# Patient Record
Sex: Female | Born: 1990 | Race: White | Hispanic: No | Marital: Married | Smoking: Former smoker
Health system: Southern US, Community
[De-identification: ages and names within clinical notes are randomized; demographics above are authoritative.]

## PROBLEM LIST (undated history)

## (undated) ENCOUNTER — Emergency Department (HOSPITAL_COMMUNITY)

## (undated) DIAGNOSIS — N189 Chronic kidney disease, unspecified: Secondary | ICD-10-CM

## (undated) DIAGNOSIS — E282 Polycystic ovarian syndrome: Secondary | ICD-10-CM

## (undated) DIAGNOSIS — Z3483 Encounter for supervision of other normal pregnancy, third trimester: Secondary | ICD-10-CM

## (undated) HISTORY — DX: Chronic kidney disease, unspecified: N18.9

---
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
## 2010-11-05 LAB — RPR: RPR Ser Ql: NONREACTIVE

## 2010-11-05 LAB — CBC
HCT: 36.1 % (ref 36.0–46.0)
HCT: 40.3 % (ref 36.0–46.0)
Hemoglobin: 12.3 g/dL (ref 12.0–15.0)
Hemoglobin: 14 g/dL (ref 12.0–15.0)
MCHC: 34 g/dL (ref 30.0–36.0)
MCHC: 34.8 g/dL (ref 30.0–36.0)
MCV: 84.3 fL (ref 78.0–100.0)
MCV: 86.1 fL (ref 78.0–100.0)
Platelets: 142 10*3/uL — ABNORMAL LOW (ref 150–400)
Platelets: 142 10*3/uL — ABNORMAL LOW (ref 150–400)
RBC: 4.19 MIL/uL (ref 3.87–5.11)
RBC: 4.79 MIL/uL (ref 3.87–5.11)
RDW: 14.8 % (ref 11.5–15.5)
RDW: 14.8 % (ref 11.5–15.5)
WBC: 11.4 10*3/uL — ABNORMAL HIGH (ref 4.0–10.5)
WBC: 11.5 10*3/uL — ABNORMAL HIGH (ref 4.0–10.5)

## 2010-11-05 LAB — CCBB MATERNAL DONOR DRAW

## 2010-12-10 NOTE — Discharge Summary (Signed)
NAMEELECIA, Bianca Maxwell               ACCOUNT NO.:  [id number]   MEDICAL RECORD NO.:  [id number]          PATIENT TYPE:  INP   LOCATION:  9101                          FACILITY:  [facility]   PHYSICIAN:  Sherron Monday, MD        DATE OF BIRTH:  Aug 10, 1990   DATE OF ADMISSION:  11/27/2008  DATE OF DISCHARGE:  11/28/2008                               DISCHARGE SUMMARY   ADMITTING DIAGNOSES:  Intrauterine pregnancy at term, large for  gestation infant, polyhydramnios.   DISCHARGE DIAGNOSES:  Intrauterine pregnancy at term, large for  gestation infant, polyhydramnios, delivered via spontaneous vaginal  delivery.   PROCEDURE:  Induction of labor.   For complete history and physical, please see the dictated H&P.  However, in brief, an 20 year old G2, P1 at 40 plus weeks for induction  of labor given LGA baby and polyhydramnios.  She was admitted, had an  induction of labor with artificial rupture of membranes for large amount  of clear fluid.  She was given Pitocin to augment her labor.  She  progressed to complete complete +2 push for approximately 10 minutes to  deliver a viable female infant weighing 9 pounds and 14 ounces with  Apgars of 8 at 1 minute and 9 at 5 minutes, and placenta was expressed  intact.  She had a periclitoral and perineal abrasion that was  hemostatic, so no suturing was needed.  She desired discharge to home on  postpartum day #1.  She states that she had no complaints except that  she was somewhat sore.  She had normal lochia.  The pain was well  controlled.  Her hemoglobin decreased from 14-12.3.  She will be  discharged to home pending the baby's discharge.   DISCHARGE INFORMATIONS:  She is O+, rubella immune.  She plans both  breast and bottle feed.  We discussed contraception, but she is  undecided. We will discuss this again at a six-week checkup.  Hemoglobin  decreased from 14 to 12.0.      Sherron Monday, MD  Electronically Signed     JB/MEDQ  D:   11/28/2008  T:  11/28/2008  Job:  [id number]

## 2010-12-10 NOTE — H&P (Signed)
NAMEALAIRA, LEVEL               ACCOUNT NO.:  [id number]   MEDICAL RECORD NO.:  [id number]          PATIENT TYPE:  MAT   LOCATION:  [facility]C                          FACILITY:  [facility]   PHYSICIAN:  Sherron Monday, MD        DATE OF BIRTH:  10-Jan-1991   DATE OF ADMISSION:  11/19/2008  DATE OF DISCHARGE:  11/19/2008                              HISTORY & PHYSICAL   ADMISSION DIAGNOSES:  Intrauterine pregnancy at term, LGA infant,  polyhydramnios.   PROCEDURE PLANNED:  Induction of labor with rupture of membranes and  Pitocin.   HISTORY OF PRESENT ILLNESS:  A 20 year old G2 P1-0-0-1 at 67 plus weeks  for induction of labor given her LGA infant and polyhydramnios.  She  also has a history vesicoureteral reflux.  Her prenatal care has been  complicated by polyhydramnios and then LGA infant.  She has received  growth scans throughout.  The most recent of which was on November 24, 2008, revealing estimated fetal weight of 9 pounds and 10 ounces, as  well as a vertex presentation.   PAST MEDICAL HISTORY:  Significant for reflux.   PAST SURGICAL HISTORY:  Not significant.   PAST OB/GYN HISTORY:  G1 was in December 2007, vaginal delivery, weight  7 pounds 15 ounces female infant by vaginal delivery.  G2 is the present  pregnancy complicated by LGA and polyhydramnios.  No history of any  abnormal Pap smears or sexually transmitted diseases.   MEDICATIONS:  Prenatal vitamins.   ALLERGIES:  She has no known drug allergies.   SOCIAL HISTORY:  History of tobacco use; however pregnancy.  No  alcohol or drug use in the pregnancy.  She is single.   FAMILY HISTORY:  Significant for the bipolar disease in a sister,  gestational diabetes in a cousin, thyroid disease in maternal  grandmother, maternal cousin with lung and pharyngeal cancer, maternal  aunt with brain cancer.  Maternal grandfather with lung cancer.   PHYSICAL EXAMINATION:  GENERAL:  She is afebrile.  She is in no apparent  distress.  VITAL SIGNS:  Stable.  Benign exam.  CARDIOVASCULAR:  Regular rate and rhythm.  LUNGS:  Clear to auscultation bilaterally.  ABDOMEN:  Soft.  Fundus is nontender.  The fundal height is  approximately 44.  EXTREMITIES:  Symmetric and nontender.  VAGINAL:  She is 3-4 cm dilated, 50% effaced, -2 station with vertex  presentation.  In the office, her heart tones were 140s.   PRENATAL LABORATORY DATA:  Hemoglobin 13.5 platelets 214,000.  RPR  nonreactive.  Hepatitis B surface antigen negative.  Rubella immune, HIV  negative.  Pap smear within normal limits.  Blood type O+, MR screen  negative.  Gonorrhea, chlamydia negative.  Cystic fibrosis negative.  First trimester screen and AFP within normal limits.  Ultrasound  revealed normal anatomy in anterior placenta.  Glucola of 87.  Group B  strep was negative.  Creatinine was checked early in pregnancy, was  found to be 0.47.   ULTRASOUNDS:  She had anatomy scan at 18 weeks, revealed normal anatomy,  anterior placenta and a  female infant.  Followup scans reveals  polyhydramnios.  She is to follow with biweekly NSTs.  She was offered  an amnio for early delivery, she declined this.   ASSESSMENT/PLAN:  An 20 year old G2, P1-0-0-1 at 45 plus weeks for  induction of labor given LGA infant and polyhydramnios.  She has been  followed with growth scans and biweekly NSTs.  She will have her water  broken and receive Pitocin.  She voices understanding of the risks,  benefits, and alternatives of induction and wishes to proceed on the  3rd.      Sherron Monday, MD  Electronically Signed     JB/MEDQ  D:  11/24/2008  T:  11/25/2008  Job:  [id number]
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
## 2011-01-07 ENCOUNTER — Emergency Department (HOSPITAL_BASED_OUTPATIENT_CLINIC_OR_DEPARTMENT_OTHER)
Admission: EM | Admit: 2011-01-07 | Discharge: 2011-01-07 | Disposition: A | Discharge status: Home / Self Care | Payer: Self-pay | Attending: Emergency Medicine | Admitting: Emergency Medicine

## 2011-01-07 DIAGNOSIS — N390 Urinary tract infection, site not specified: Secondary | ICD-10-CM | POA: Insufficient documentation

## 2011-01-07 DIAGNOSIS — F17200 Nicotine dependence, unspecified, uncomplicated: Secondary | ICD-10-CM | POA: Insufficient documentation

## 2011-01-07 DIAGNOSIS — R109 Unspecified abdominal pain: Secondary | ICD-10-CM | POA: Insufficient documentation

## 2011-01-07 LAB — URINALYSIS, ROUTINE W REFLEX MICROSCOPIC
Bilirubin Urine: NEGATIVE
Glucose, UA: NEGATIVE mg/dL
Hgb urine dipstick: NEGATIVE
Ketones, ur: NEGATIVE mg/dL
Nitrite: NEGATIVE
Protein, ur: NEGATIVE mg/dL
Specific Gravity, Urine: 1.022 (ref 1.005–1.030)
Urobilinogen, UA: 0.2 mg/dL (ref 0.0–1.0)
pH: 6.5 (ref 5.0–8.0)

## 2011-01-07 LAB — URINE MICROSCOPIC-ADD ON

## 2011-01-07 LAB — WET PREP, GENITAL
Trich, Wet Prep: NONE SEEN
Yeast Wet Prep HPF POC: NONE SEEN

## 2011-01-09 LAB — GC/CHLAMYDIA PROBE AMP, GENITAL: GC Probe Amp, Genital: NEGATIVE

## 2012-09-15 ENCOUNTER — Encounter (HOSPITAL_BASED_OUTPATIENT_CLINIC_OR_DEPARTMENT_OTHER): Payer: Self-pay | Admitting: *Deleted

## 2012-09-15 ENCOUNTER — Emergency Department (HOSPITAL_BASED_OUTPATIENT_CLINIC_OR_DEPARTMENT_OTHER)
Admission: EM | Admit: 2012-09-15 | Discharge: 2012-09-15 | Disposition: A | Discharge status: Home / Self Care | Payer: Managed Care, Other (non HMO) | Attending: Emergency Medicine | Admitting: Emergency Medicine

## 2012-09-15 DIAGNOSIS — F17200 Nicotine dependence, unspecified, uncomplicated: Secondary | ICD-10-CM | POA: Insufficient documentation

## 2012-09-15 DIAGNOSIS — K089 Disorder of teeth and supporting structures, unspecified: Secondary | ICD-10-CM | POA: Insufficient documentation

## 2012-09-15 DIAGNOSIS — K0889 Other specified disorders of teeth and supporting structures: Secondary | ICD-10-CM

## 2012-09-15 MED ORDER — CLINDAMYCIN HCL 300 MG PO CAPS: 300.0000 mg | ORAL_CAPSULE | Freq: Three times a day (TID) | ORAL | Status: DC

## 2012-09-15 MED ORDER — HYDROCODONE-ACETAMINOPHEN 5-325 MG PO TABS: 1.0000 | ORAL_TABLET | Freq: Four times a day (QID) | ORAL | Status: DC | PRN

## 2012-09-15 NOTE — ED Notes (Signed)
Pt c/o right jaw and toothache pain  X 1 week

## 2012-09-15 NOTE — ED Provider Notes (Signed)
History     CSN: [id number]  Arrival date & time 09/15/12  2004   First MD Initiated Contact with Patient 09/15/12 2213      Chief Complaint  Patient presents with  . Dental Pain    (Consider location/radiation/quality/duration/timing/severity/associated sxs/prior treatment) HPI Pt c/o pain in right upper molar/wisdom tooth which is coming in.  Pain has been present for several weeks, but worse over the past several days.  No fever/chills, no difficulty swallowing or breathing.  No vomiting.  Has not made an appointment with dentist.  No facial swelling.  Pain is constant and worse at night.  There are no other associated systemic symptoms, there are no other alleviating or modifying factors.   History reviewed. No pertinent past medical history.  History reviewed. No pertinent past surgical history.  History reviewed. No pertinent family history.  History  Substance Use Topics  . Smoking status: Current Some Day Smoker -- 0.50 packs/day    Types: Cigarettes  . Smokeless tobacco: Not on file  . Alcohol Use: No    OB History   Grav Para Term Preterm Abortions TAB SAB Ect Mult Living                  Review of Systems ROS reviewed and all otherwise negative except for mentioned in HPI  Allergies  Review of patient's allergies indicates no known allergies.  Home Medications   Current Outpatient Rx  Name  Route  Sig  Dispense  Refill  . clindamycin (CLEOCIN) 300 MG capsule   Oral   Take 1 capsule (300 mg total) by mouth 3 (three) times daily.   21 capsule   0   . HYDROcodone-acetaminophen (NORCO) 5-325 MG per tablet   Oral   Take 1 tablet by mouth every 6 (six) hours as needed for pain.   10 tablet   0     BP 135/83  Temp(Src) 98.4 F (36.9 C) (Oral)  Resp 20  Ht 5\' 5"  (1.651 m)  Wt 245 lb (111.131 kg)  BMI 40.77 kg/m2  SpO2 100% Vitals reviewed Physical Exam Physical Examination: General appearance - alert, well appearing, and in no  distress Mental status - alert, oriented to person, place, and time Eyes - no conjunctival injection, no scleral icterus Mouth - mucous membranes moist, pharynx normal without lesions, wisdom teeth erupting- mild erythema around right upper posterior molar, no gingival abscess, no swelling to suggest periapical abscess Neck - supple, mild right cervical reactive lymphadenopathy Extremities - peripheral pulses normal, no pedal edema, no clubbing or cyanosis Skin - normal coloration and turgor, no rashes  ED Course  Procedures (including critical care time)  Labs Reviewed - No data to display No results found.   1. Pain, dental       MDM  Pt presenting with pain from wisdom teeth erupting.  Small amount of swelling and erythema around right upper posterior molar.  Pt started on clindamycin and given pain medication.  Strongly encouraged to arrange for follow up with dentist for definitive care.          Ethelda Chick, MD 09/16/12 [id number]

## 2012-09-15 NOTE — ED Notes (Signed)
rx x 2 given for clindamycin and hydrocodone

## 2012-09-15 NOTE — ED Notes (Signed)
MD at bedside. 

## 2013-09-19 LAB — OB RESULTS CONSOLE GC/CHLAMYDIA
CHLAMYDIA, DNA PROBE: NEGATIVE
GC PROBE AMP, GENITAL: NEGATIVE

## 2013-09-19 LAB — OB RESULTS CONSOLE RPR: RPR: NONREACTIVE

## 2013-09-19 LAB — OB RESULTS CONSOLE HEPATITIS B SURFACE ANTIGEN: Hepatitis B Surface Ag: NEGATIVE

## 2013-09-19 LAB — OB RESULTS CONSOLE ANTIBODY SCREEN: ANTIBODY SCREEN: NEGATIVE

## 2013-09-19 LAB — OB RESULTS CONSOLE HIV ANTIBODY (ROUTINE TESTING): HIV: NONREACTIVE

## 2013-09-19 LAB — OB RESULTS CONSOLE ABO/RH: RH Type: POSITIVE

## 2013-09-19 LAB — OB RESULTS CONSOLE RUBELLA ANTIBODY, IGM: Rubella: NON-IMMUNE/NOT IMMUNE

## 2014-01-26 LAB — OB RESULTS CONSOLE GBS: STREP GROUP B AG: NEGATIVE

## 2014-02-14 ENCOUNTER — Telehealth (HOSPITAL_COMMUNITY): Payer: Self-pay | Admitting: *Deleted

## 2014-02-14 ENCOUNTER — Encounter (HOSPITAL_COMMUNITY): Payer: Self-pay | Admitting: *Deleted

## 2014-02-14 NOTE — Telephone Encounter (Signed)
Preadmission screen  

## 2014-02-15 ENCOUNTER — Encounter (HOSPITAL_COMMUNITY): Payer: Self-pay

## 2014-02-15 DIAGNOSIS — O26839 Pregnancy related renal disease, unspecified trimester: Secondary | ICD-10-CM | POA: Diagnosis not present

## 2014-02-15 DIAGNOSIS — O99891 Other specified diseases and conditions complicating pregnancy: Secondary | ICD-10-CM | POA: Diagnosis not present

## 2014-02-15 DIAGNOSIS — Z3483 Encounter for supervision of other normal pregnancy, third trimester: Secondary | ICD-10-CM

## 2014-02-15 HISTORY — DX: Encounter for supervision of other normal pregnancy, third trimester: Z34.83

## 2014-02-15 NOTE — H&P (Signed)
Bianca Maxwell is a 23 y.o. 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  +FM, no LOF, no VB, occ ctx Maternal Medical History:  Contractions: Frequency: irregular.    Fetal activity: Perceived fetal activity is normal.    Prenatal complications: no prenatal complications Prenatal Complications - Diabetes: none.    OB History   Grav Para Term Preterm Abortions TAB SAB Ect Mult Living   3 2 2       2     G!&2 TSVD, 7#15, 9#14; no STD, no abn pap Past Medical History  Diagnosis Date  . Chronic kidney disease   . Normal pregnancy in multigravida in third trimester 02/15/2014   PSH: none Family History: spina bifida, kidney disease, MR, GDM, lung CA, pharyngeal CA, thyroid dz, brain CA, lung CA Social History:  reports that she has been smoking Cigarettes.  She has been smoking about 0.50 packs per day. She does not have any smokeless tobacco history on file. She reports that she does not drink alcohol or use illicit drugs.married, SAHM  Meds PNV All NKDA   Prenatal Transfer Tool  Maternal Diabetes: No Genetic Screening: Normal Maternal Ultrasounds/Referrals: Normal Fetal Ultrasounds or other Referrals:  None Maternal Substance Abuse:  No Significant Maternal Medications:  None Significant Maternal Lab Results:  Lab values include: Group B Strep negative Other Comments:  CF neg,   Review of Systems  Constitutional: Negative.   HENT: Negative.   Eyes: Negative.   Respiratory: Negative.   Cardiovascular: Negative.   Gastrointestinal: Negative.   Genitourinary: Negative.   Musculoskeletal: Negative.   Skin: Negative.   Neurological: Negative.   Psychiatric/Behavioral: Negative.       Last menstrual period 05/25/2013, unknown if currently breastfeeding. Maternal Exam:  Uterine Assessment: Contraction frequency is irregular.   Abdomen: Fundal  height is appropriate for gestation.   Estimated fetal weight is 9#.   Fetal presentation: vertex  Introitus: Normal vulva. Normal vagina.  Pelvis: adequate for delivery.   Cervix: Cervix evaluated by digital exam.     Physical Exam  Constitutional: She is oriented to person, place, and time. She appears well-developed and well-nourished.  HENT:  Head: Normocephalic and atraumatic.  Cardiovascular: Normal rate and regular rhythm.   Respiratory: Effort normal and breath sounds normal. No respiratory distress. She has no wheezes.  GI: Soft. Bowel sounds are normal. She exhibits no distension. There is no tenderness.  Musculoskeletal: Normal range of motion.  Neurological: She is alert and oriented to person, place, and time.  Skin: Skin is warm and dry.  Psychiatric: She has a normal mood and affect. Her behavior is normal.    Prenatal labs: ABO, Rh: O/Positive/-- (02/23 0000) Antibody: Negative (02/23 0000) Rubella: Nonimmune (02/23 0000) RPR: Nonreactive (02/23 0000)  HBsAg: Negative (02/23 0000)  HIV: Non-reactive (02/23 0000)  GBS:   negative  Tdap 12/14/13 Ur Cx +. Firt Tri and AFP WNL/GC neg/ Chl neg/glucola 87/ Plt 249L Assessment/Plan: 23yo G3P2002 at 39+ for IOL given term/favorable cervix RNI - MMR PP GBBS neg AROM andptocin for IOL Expect SVD   Bovard-Stuckert, Tommey Barret 02/15/2014, 10:26 PM

## 2014-02-16 ENCOUNTER — Inpatient Hospital Stay (HOSPITAL_COMMUNITY)
Admission: RE | Admit: 2014-02-16 | Discharge: 2014-02-17 | DRG: 775 | Disposition: A | Discharge status: Home / Self Care | Payer: Managed Care, Other (non HMO) | Source: Ambulatory Visit | Attending: Obstetrics and Gynecology | Admitting: Obstetrics and Gynecology

## 2014-02-16 ENCOUNTER — Encounter (HOSPITAL_COMMUNITY): Payer: Self-pay

## 2014-02-16 ENCOUNTER — Encounter (HOSPITAL_COMMUNITY): Payer: Managed Care, Other (non HMO) | Admitting: Anesthesiology

## 2014-02-16 ENCOUNTER — Inpatient Hospital Stay (HOSPITAL_COMMUNITY): Payer: Managed Care, Other (non HMO) | Admitting: Anesthesiology

## 2014-02-16 VITALS — BP 110/72 | HR 76 | Temp 97.6°F | Resp 18 | Ht 65.5 in | Wt 257.0 lb

## 2014-02-16 DIAGNOSIS — O99891 Other specified diseases and conditions complicating pregnancy: Secondary | ICD-10-CM | POA: Diagnosis present

## 2014-02-16 DIAGNOSIS — Z3483 Encounter for supervision of other normal pregnancy, third trimester: Secondary | ICD-10-CM

## 2014-02-16 DIAGNOSIS — O99214 Obesity complicating childbirth: Secondary | ICD-10-CM

## 2014-02-16 DIAGNOSIS — O99334 Smoking (tobacco) complicating childbirth: Secondary | ICD-10-CM | POA: Diagnosis present

## 2014-02-16 DIAGNOSIS — Z808 Family history of malignant neoplasm of other organs or systems: Secondary | ICD-10-CM | POA: Diagnosis not present

## 2014-02-16 DIAGNOSIS — E669 Obesity, unspecified: Secondary | ICD-10-CM | POA: Diagnosis present

## 2014-02-16 DIAGNOSIS — Z3493 Encounter for supervision of normal pregnancy, unspecified, third trimester: Secondary | ICD-10-CM

## 2014-02-16 DIAGNOSIS — N189 Chronic kidney disease, unspecified: Secondary | ICD-10-CM | POA: Diagnosis present

## 2014-02-16 DIAGNOSIS — K219 Gastro-esophageal reflux disease without esophagitis: Secondary | ICD-10-CM | POA: Diagnosis present

## 2014-02-16 DIAGNOSIS — Z6841 Body Mass Index (BMI) 40.0 and over, adult: Secondary | ICD-10-CM

## 2014-02-16 DIAGNOSIS — O26839 Pregnancy related renal disease, unspecified trimester: Principal | ICD-10-CM | POA: Diagnosis present

## 2014-02-16 DIAGNOSIS — Z801 Family history of malignant neoplasm of trachea, bronchus and lung: Secondary | ICD-10-CM | POA: Diagnosis not present

## 2014-02-16 HISTORY — DX: Encounter for supervision of other normal pregnancy, third trimester: Z34.83

## 2014-02-16 LAB — CBC
HCT: 38.1 % (ref 36.0–46.0)
HEMOGLOBIN: 12.4 g/dL (ref 12.0–15.0)
MCH: 25.4 pg — AB (ref 26.0–34.0)
MCHC: 32.5 g/dL (ref 30.0–36.0)
MCV: 77.9 fL — ABNORMAL LOW (ref 78.0–100.0)
Platelets: 138 10*3/uL — ABNORMAL LOW (ref 150–400)
RBC: 4.89 MIL/uL (ref 3.87–5.11)
RDW: 15.1 % (ref 11.5–15.5)
WBC: 10.2 10*3/uL (ref 4.0–10.5)

## 2014-02-16 LAB — TYPE AND SCREEN
ABO/RH(D): O POS
ANTIBODY SCREEN: NEGATIVE

## 2014-02-16 LAB — ABO/RH: ABO/RH(D): O POS

## 2014-02-16 LAB — RPR

## 2014-02-16 MED ORDER — BENZOCAINE-MENTHOL 20-0.5 % EX AERO: 1.0000 "application " | INHALATION_SPRAY | CUTANEOUS | Status: DC | PRN

## 2014-02-16 MED ORDER — ZOLPIDEM TARTRATE 5 MG PO TABS: 5.0000 mg | ORAL_TABLET | Freq: Every evening | ORAL | Status: DC | PRN

## 2014-02-16 MED ORDER — EPHEDRINE 5 MG/ML INJ
10.0000 mg | INTRAVENOUS | Status: DC | PRN
  Filled 2014-02-16: qty 2

## 2014-02-16 MED ORDER — CITRIC ACID-SODIUM CITRATE 334-500 MG/5ML PO SOLN: 30.0000 mL | ORAL | Status: DC | PRN

## 2014-02-16 MED ORDER — PRENATAL MULTIVITAMIN CH
1.0000 | ORAL_TABLET | Freq: Every day | ORAL | Status: DC
  Administered 2014-02-17: 1 via ORAL
  Filled 2014-02-16: qty 1

## 2014-02-16 MED ORDER — OXYCODONE-ACETAMINOPHEN 5-325 MG PO TABS: 1.0000 | ORAL_TABLET | ORAL | Status: DC | PRN

## 2014-02-16 MED ORDER — PHENYLEPHRINE 40 MCG/ML (10ML) SYRINGE FOR IV PUSH (FOR BLOOD PRESSURE SUPPORT)
80.0000 ug | PREFILLED_SYRINGE | INTRAVENOUS | Status: DC | PRN
  Filled 2014-02-16: qty 10
  Filled 2014-02-16: qty 2

## 2014-02-16 MED ORDER — IBUPROFEN 600 MG PO TABS
600.0000 mg | ORAL_TABLET | Freq: Four times a day (QID) | ORAL | Status: DC
  Administered 2014-02-17 (×4): 600 mg via ORAL
  Filled 2014-02-16 (×4): qty 1

## 2014-02-16 MED ORDER — OXYTOCIN BOLUS FROM INFUSION
500.0000 mL | INTRAVENOUS | Status: DC
  Administered 2014-02-16: 500 mL via INTRAVENOUS

## 2014-02-16 MED ORDER — TETANUS-DIPHTH-ACELL PERTUSSIS 5-2.5-18.5 LF-MCG/0.5 IM SUSP: 0.5000 mL | Freq: Once | INTRAMUSCULAR | Status: DC

## 2014-02-16 MED ORDER — ACETAMINOPHEN 325 MG PO TABS
650.0000 mg | ORAL_TABLET | ORAL | Status: DC | PRN
  Administered 2014-02-16: 650 mg via ORAL
  Filled 2014-02-16: qty 2

## 2014-02-16 MED ORDER — LACTATED RINGERS IV SOLN: 500.0000 mL | INTRAVENOUS | Status: DC | PRN

## 2014-02-16 MED ORDER — BUTORPHANOL TARTRATE 1 MG/ML IJ SOLN: 1.0000 mg | INTRAMUSCULAR | Status: DC | PRN

## 2014-02-16 MED ORDER — OXYTOCIN 40 UNITS IN LACTATED RINGERS INFUSION - SIMPLE MED
1.0000 m[IU]/min | INTRAVENOUS | Status: DC
  Administered 2014-02-16: 2 m[IU]/min via INTRAVENOUS
  Filled 2014-02-16: qty 1000

## 2014-02-16 MED ORDER — LIDOCAINE HCL (PF) 1 % IJ SOLN
INTRAMUSCULAR | Status: DC | PRN
  Administered 2014-02-16 (×2): 5 mL

## 2014-02-16 MED ORDER — FENTANYL 2.5 MCG/ML BUPIVACAINE 1/10 % EPIDURAL INFUSION (WH - ANES)
14.0000 mL/h | INTRAMUSCULAR | Status: DC | PRN
  Administered 2014-02-16 (×2): 14 mL/h via EPIDURAL
  Filled 2014-02-16 (×2): qty 125

## 2014-02-16 MED ORDER — ONDANSETRON HCL 4 MG/2ML IJ SOLN: 4.0000 mg | INTRAMUSCULAR | Status: DC | PRN

## 2014-02-16 MED ORDER — WITCH HAZEL-GLYCERIN EX PADS: 1.0000 "application " | MEDICATED_PAD | CUTANEOUS | Status: DC | PRN

## 2014-02-16 MED ORDER — PHENYLEPHRINE 40 MCG/ML (10ML) SYRINGE FOR IV PUSH (FOR BLOOD PRESSURE SUPPORT)
80.0000 ug | PREFILLED_SYRINGE | INTRAVENOUS | Status: DC | PRN
  Filled 2014-02-16: qty 2

## 2014-02-16 MED ORDER — DIPHENHYDRAMINE HCL 50 MG/ML IJ SOLN: 12.5000 mg | INTRAMUSCULAR | Status: DC | PRN

## 2014-02-16 MED ORDER — IBUPROFEN 600 MG PO TABS
600.0000 mg | ORAL_TABLET | Freq: Four times a day (QID) | ORAL | Status: DC | PRN
  Administered 2014-02-16: 600 mg via ORAL
  Filled 2014-02-16: qty 1

## 2014-02-16 MED ORDER — LACTATED RINGERS IV SOLN
500.0000 mL | Freq: Once | INTRAVENOUS | Status: AC
  Administered 2014-02-16: 500 mL via INTRAVENOUS

## 2014-02-16 MED ORDER — DIPHENHYDRAMINE HCL 25 MG PO CAPS: 25.0000 mg | ORAL_CAPSULE | Freq: Four times a day (QID) | ORAL | Status: DC | PRN

## 2014-02-16 MED ORDER — SIMETHICONE 80 MG PO CHEW: 80.0000 mg | CHEWABLE_TABLET | ORAL | Status: DC | PRN

## 2014-02-16 MED ORDER — DIBUCAINE 1 % RE OINT: 1.0000 "application " | TOPICAL_OINTMENT | RECTAL | Status: DC | PRN

## 2014-02-16 MED ORDER — OXYTOCIN 40 UNITS IN LACTATED RINGERS INFUSION - SIMPLE MED
62.5000 mL/h | INTRAVENOUS | Status: DC
  Administered 2014-02-16: 62.5 mL/h via INTRAVENOUS

## 2014-02-16 MED ORDER — SENNOSIDES-DOCUSATE SODIUM 8.6-50 MG PO TABS
2.0000 | ORAL_TABLET | ORAL | Status: DC
  Administered 2014-02-17: 2 via ORAL
  Filled 2014-02-16: qty 2

## 2014-02-16 MED ORDER — LANOLIN HYDROUS EX OINT: TOPICAL_OINTMENT | CUTANEOUS | Status: DC | PRN

## 2014-02-16 MED ORDER — LIDOCAINE HCL (PF) 1 % IJ SOLN
30.0000 mL | INTRAMUSCULAR | Status: DC | PRN
  Filled 2014-02-16: qty 30

## 2014-02-16 MED ORDER — LACTATED RINGERS IV SOLN
INTRAVENOUS | Status: DC
  Administered 2014-02-16 (×2): via INTRAVENOUS

## 2014-02-16 MED ORDER — LACTATED RINGERS IV SOLN: INTRAVENOUS | Status: DC

## 2014-02-16 MED ORDER — ONDANSETRON HCL 4 MG PO TABS: 4.0000 mg | ORAL_TABLET | ORAL | Status: DC | PRN

## 2014-02-16 MED ORDER — ONDANSETRON HCL 4 MG/2ML IJ SOLN: 4.0000 mg | Freq: Four times a day (QID) | INTRAMUSCULAR | Status: DC | PRN

## 2014-02-16 MED ORDER — TERBUTALINE SULFATE 1 MG/ML IJ SOLN: 0.2500 mg | Freq: Once | INTRAMUSCULAR | Status: DC | PRN

## 2014-02-16 NOTE — Anesthesia Procedure Notes (Signed)
Epidural Patient location during procedure: OB Start time: 02/16/2014 11:02 AM  Staffing Anesthesiologist: Brayton CavesJACKSON, Nemiah Kissner Performed by: anesthesiologist   Preanesthetic Checklist Completed: patient identified, site marked, surgical consent, pre-op evaluation, timeout performed, IV checked, risks and benefits discussed and monitors and equipment checked  Epidural Patient position: sitting Prep: site prepped and draped and DuraPrep Patient monitoring: continuous pulse ox and blood pressure Approach: midline Location: L3-L4 Injection technique: LOR air  Needle:  Needle type: Tuohy  Needle gauge: 17 G Needle length: 9 cm and 9 Needle insertion depth: 7 cm Catheter type: closed end flexible Catheter size: 19 Gauge Catheter at skin depth: 12 cm Test dose: negative  Assessment Events: blood not aspirated, injection not painful, no injection resistance, negative IV test and no paresthesia  Additional Notes Patient identified.  Risk benefits discussed including failed block, incomplete pain control, headache, nerve damage, paralysis, blood pressure changes, nausea, vomiting, reactions to medication both toxic or allergic, and postpartum back pain.  Patient expressed understanding and wished to proceed.  All questions were answered.  Sterile technique used throughout procedure and epidural site dressed with sterile barrier dressing. No paresthesia or other complications noted.The patient did not experience any signs of intravascular injection such as tinnitus or metallic taste in mouth nor signs of intrathecal spread such as rapid motor block. Please see nursing notes for vital signs.

## 2014-02-16 NOTE — Progress Notes (Signed)
Patient ID: Bianca Maxwell, female   DOB: 01/12/1991, 23 y.o.   MRN: [id number]  Reviewed H&P, no changes No c/o's x nervous  AFVSS gen NAD FHTs 140's, category 1 toco Q 5-2610min  SVE 2/50/-3  US to confirm vtx  22yo G3P2002 at 39+ gbbs neg AROM after Pitocin Expect SVD

## 2014-02-16 NOTE — Anesthesia Preprocedure Evaluation (Addendum)
Anesthesia Evaluation  Patient identified by MRN, date of birth, ID band Patient awake    Reviewed: Allergy & Precautions, H&P , Patient's Chart, lab work & pertinent test results  Airway Mallampati: III TM Distance: >3 FB Neck ROM: full    Dental   Pulmonary Current Smoker,  breath sounds clear to auscultation        Cardiovascular Rhythm:regular Rate:Normal     Neuro/Psych    GI/Hepatic   Endo/Other  Morbid obesity  Renal/GU Renal disease     Musculoskeletal   Abdominal   Peds  Hematology   Anesthesia Other Findings   Reproductive/Obstetrics (+) Pregnancy                          Anesthesia Physical Anesthesia Plan  ASA: III  Anesthesia Plan: Epidural   Post-op Pain Management:    Induction:   Airway Management Planned:   Additional Equipment:   Intra-op Plan:   Post-operative Plan:   Informed Consent: I have reviewed the patients History and Physical, chart, labs and discussed the procedure including the risks, benefits and alternatives for the proposed anesthesia with the patient or authorized representative who has indicated his/her understanding and acceptance.     Plan Discussed with:   Anesthesia Plan Comments:         Anesthesia Quick Evaluation

## 2014-02-16 NOTE — Progress Notes (Signed)
Patient ID: Bianca Maxwell, female   DOB: 10-26-1990, 23 y.o.   MRN: [id number]  Comfortable with epdiural  AFVSS gen NAD FHTs 140's, category 1 toco Q 803-8Surgery Center Of8458Hsc Surgical Associates Of [facility] LLCNGE706-Kings423-4B9818 S101Bl295269JohnsieModen406JamaGainesville Fl Orthopaedic Asc LLC Dba Orthopaedic Surgery CentNorth Star Hospital - [phone] Lo40922HeronSa501Katrinka Blazi2 ng832-3Curry General31477Gundersen Tri County Mem HsptlNGE([phone] Sprin981936 Liv22Bl295269JohnsieModen5165JamaColumbus Endoscopy Center IMission Hospital Regional MeL812-234-59N6PrincessZThomasene Lo40975WestfieldPrairSa602Katrinka Blazi2 ng(508) 6East [place] Surgery C3911Meade District HospitalNGE(937)St(323)1Wadswor98122 Ca36Bl295269JohnsieModen540-3JamaSsm Health St. Clare HospitSt John MeL331-3467N6PrincessZThomasene Lo40918MaynardSaSa312Katrinka Blazi7 ng(336)1[facility] For Childr5721MiLLCreek Community HospitalNGE773-Kni714-3Upper Kalsk98178156Bl295269JohnsieModen(352) 8JamaChesapeake Eye Surgery Center LHolland [phone])81N6PrincessZThomasene Lo40949DexterJamSa256Katrinka Blazi6 ng916-5Winston Medic4843South Geary Endoscopy Center NortheastNGE(308)Nespelem Com239 4Franklinto981724 Ar58Bl295269JohnsieModen(531) 37JamaTomah Va Medical CentTennova HealthcareL21419-25N6PrincessZThomasene Lo40954New AlbanSa702Katrinka Blazi7 ng315-4[facility]- We55446Crittenden Hospital AssociationNGE(325480Plattvil3Bl295269JohnsieModen(612)37JamaPost Acute Medical Specialty Hospital Of MilwaukParkway EndoL313-860-72N6PrincessZThomasene Lo40932Siler CityOSa55Katrinka Blazi ng(506)0Fieldsto65635Mercy HospitalNGE(443)C403Pleasantvil69Bl295269JohnsieModen913-43JamaMemorial Hospital IWalthall County GeneL203-412-45N6PrincessZThomasene Lo40984East Highland ParkSouth DeeSa296Katrinka Blazi3 ng612-2[facility] Center511Javon Bea Hospital Dba Mercy [facility] Hospital Rockton AveNGE([phone]nsieModen905-6JamaMercy Medical Center-DubuqRocky Mountain EndoscopyL738626N6PrincessZThomasene Lo40925Coulee CityScaSa563Katrinka Blazi1 ng559Wyoming County Community85Centro De Salud Integral De OrocovisNGE873 Tiger712-4Burles981520 S84Bl295269JohnsieModen850-2JamaPresence Central And Suburban Hospitals Network Dba Precence St Marys HospitBloomfield Surgi Center LLC Dba Ambulatory Center Of [phone]) 49N6PrincessZThomasene Lo40935Rio LucioMenSa231Katrinka Blazi8 ng864-1[facility] 2216Northern Montana HospitalNGE718-K325-6Erics981847Bl295269JohnsieModen(702)74JamaAppling Healthcare SystRiver Vista Health And L548 407-09N6PrincessZThomasene Lo40957Bear RiverJSa4Katrinka Blazi5 ng519Sycamor698Poway Surgery CenterNGE863-Renville Ju360Theodos981604 East Cher53Bl295269JohnsieModen223-67JamaSt. Mary'S Regional Medical CentChi St Joseph ReL614-709 63N6PrincessZThomasene Lo40916StillmSa712Katrinka Blazi7 ng323-3Glen Endoscopy C13858West Tennessee Healthcare Rehabilitation Hospital Cane CreekNGE(417681-3Seeley La98173172Bl295269JohnsieModen781 3JamaTavares Surgery LSyracuse EndoscopL213184N6PrincessZThomasene Lo40920East Port OrchardSt. StSa757Katrinka Blazi6 ng629Quincy Valley Medic9068Rockford Orthopedic Surgery CenterNGE408-(435)8Brans981382 77Bl295269JohnsieModen(305) 5JamaEssentia Health St Josephs MHopi Health Care Center/Dhhs Ihs L365-548-38N6PrincessZThomasene Lo40984MonongahelaIndian SSa626Katrinka Blazi1 ng813-0Saint Josephs Wayne754Pacific Endoscopy CenterNGE956-Bria925Cas9817010 Oa87Bl295269JohnsieModen508-30JamaEast Los Angeles Doctors HospitPhysicians Outpatient SurgerL548-(269) 61N6PrincessZThomasene Lo40953OcoeAngSa48Katrinka Blazi7 ng503-0Essentia Health St Jo994Northwest Endoscopy Center LLCNGE816-St. Pete340Elber98189188Bl295269JohnsieModen610-4JamaNorth Vista HospitWest Florida Medical CentL([phone])585-60N6PrincessZThomasene Lo40968Red BankAlexS77aKatrinka Blazi5 ng754Moberly Regional Medic10641Faulkton Area Medical CenterNGE360-Plainfield V267-6Falkla981837Bl295269JohnsieModen684-JamaOutpatient Womens And Childrens Surgery Center LAudubon County MemorL403 [phone] Lo40917LehrBentleSa789Katrinka Blazi5 ng636-8Sparrow Clinton20631Hammond Community Ambulatory Care Center LLCNGE24Gett605-1Robinet98164Bl295269JohnsieModen640-60JamaPennsylvania Eye And Ear SurgeAngel MeL240-4788N6PrincessZThomasene Lo40928Elk MountainSentineSa852Katrinka Blazi ng(616) 2[facility] T10749Samaritan Albany General HospitalNGE442732-2Santaqu9856Bl295269JohnsieModen(256)61JamaUniversity Of Mn Med CJames A. Haley Veterans' Hospital PrimarL985-601-05N6PrincessZThomasene Lo40952HomHSa613Katrinka Blazi4 ng321Avenues Surgic6418Endoscopic Procedure Center LLCNGE614-Pose210-2Baysho9817979 57Bl295269JohnsieModen(470)24JamaNorth Campus Surgery Center LDigestive [phone] Lo40942ClaytonCopakSa663Katrinka Blazi5 ng(801)1Vista Surgic39659Advanced Surgery Center Of Central IowaNGE(618)Mountlake T712-3Fro98197411Bl295269JohnsieModen(860) 3JamaPres[address] Luke'S Medical CentUniversity EndoL(747)702 06N6PrincessZThomasene Lo40975NelchinaSa316Katrinka Blazi9 ng567The Outpatient Center 7046Vibra Hospital Of CharlestonNGE(228)Lyndon S801-3Cos95Bl295269JohnsieModen(248)60JamaAtlanticare Surgery Center Ocean CounAscension Our Lady Of VL([phone])225-53N6PrincessZThomasene Lo40952Smithville-SandersFaSa488Katrinka Blazi4 ng716-7Providence Alaska Medic329Scripps Memorial Hospital - EncinitasNGE607-South New (210)2Ze61Bl295269JohnsieModen364-43JamaMercy Hospital JoplWestern New York Children'S PsychiL(714) 248-33N6PrincessZThomasene Lo40952JohnsonvillEast Alto Sa459Katrinka Blazi5 ng712-8Winnebago Mental Hlth 573Holyoke Medical CenterNGE531-C873 7Du98141 B51Bl295269JohnsieModen477JamaSt. Joseph Hospital - OranSummit L762-304-72N6PrincessZThomasene Lo409100AmagansetPort CSa3Katrinka Blazi4 ng986-1Martha Jefferson18624Franciscan St Elizabeth Health - Lafayette EastNGE619-Newbu(401)0Napole9812 Sou57Bl295269JohnsieModen819 1JamaPremiere Surgery Center INoland HospitaL419-570-66N6PrincessZThomasene Lo40947GaribaldiNew PalSa46Katrinka Blazi5 ng(208)7Vibra Hospital Of Southeastern Michigan-D1451Avera Behavioral Health CenterNGE703-509-1Alber9816894Bl295269JohnsieModen910 69JamaGenoa Community HospitCarolinas Rehabilitation -L254-504-64N6PrincessZThomasene Lo40968ClarionNewSa447Katrinka Blazi7 ng564 8Essentia Health 63276Northeast Florida State HospitalNGE972-Madeira305-8Rose98168537Bl295269JohnsieModen337-64JamaLake Health Beachwood Medical CentLicking MemorL([phone]ne Lo40950LocklandPitSa443Katrinka Blazi4 ng(514)0[place] 7925Forbes Ambulatory Surgery Center LLCNGE(548)Fort [place]660-4Edgert50Bl295269JohnsieModen934-8JamaLehigh Valley Hospital Transplant CentSan Jose BehavL321-901-33N6PrincessZThomasene Lo40927Hot SpringsBelS6aKatrinka Blazi1 ng713-2Wolf Eye Ass49o73[address] Day Surgery Center Dba Division Of Plastic And Hand Surgical AssocNGE425-Da941-7North Myrtle Bea98132Bl295269JohnsieModen762 35JamaHarmon Memorial HospitRiver PL(701)4312N6PrincessZThomasene Lo40919La CrossCSa34Katrinka Blazi5 ng508St. Francis Medic82Faxton-St. Luke'S Healthcare - Faxton CampusNGE640(610)6Dai981911 11Bl295269JohnsieModen780-57JamaRehabilitation Hospital Of The NorthweSamaritan HospitL([phone] Lo4096King GeorgHoSa35Katrinka Blazi2 ng726-1Puyallup Ambulatory Surge3063Sheppard Pratt At Ellicott CityNGE807-Gree269-4Merte9817931 No46Bl295269JohnsieModen(803)42JamaSouthwest Endoscopy LWinnie Palmer Hospital For WoL(380) [phone] Lo40985Wrightsville BeachLaSa412Katrinka Blazi2 ng442-1University Of Texas Health Cente40669Ventura County Medical CenterNGE68831Redfie9871Bl295269JohnsieModen313-70JamaRoper St Francis Eye CentAtlanticare Surgery CenL(930) 9183N6PrincessZThomasene Lo40970AntigoWeSa46Katrinka Blazi11 ng(231) 7RandoLPh60474University Of Illinois HospitalNGE662-S401-7Curdsvil9818162Bl295269JohnsieModen647-4JamaEastside Medical CentMid America RehabilitatL80956-14N6PrincessZThomasene Lo40935StarbuckCrippleSa278Katrinka Blazi8 ng(662) 4[facility] -17Minneola District HospitalNGE41New 507-7Boiling Spring Lak981775B P72Bl295269JohnsieModen412-5JamaUnion Correctional Institute HospitEncompass Health Rehabilitation Hospital Of MiL(602) 351-76N6PrincessZThomasene Lo40926Monument HillsTawSa173Katrinka Blazi3 ng213-7Gaylord4625Beckett SpringsNGE2938 3Plantati9858Bl295269JohnsieModen601-61JamaKindred Hospital - Santa ARaleigh EndoscopyL(619) (609)45N6PrincessZThomasene Lo40981BrownsvillCuaSa372Katrinka Blazi9 ng272 8[facility] Spring[facility] Hospital Hurst-Eules26434Outpatient Services EastNGE(539)The Woo(812)8Hanco9815454Bl295269JohnsieModen(959)24JamaSistersville General HospitCommunity Howard RegionaL(902)919-71N6PrincessZThomasene Lo40957Rancho CordovaForesSa62Katrinka Blazi ng708 6St Joseph'S Westgate Medic9341Johnston Memorial HospitalNGE629-Su361Ocean Ci9830Bl295269JohnsieModen(609)73JamaLudwick Laser And Surgery Center LFroedtert South St Catherines MeL80(269) 42N6PrincessZThomasene Lo40921BloomsburyWest BraSa53Katrinka Blazi6 ng401-6Coon Memorial Hospital6383Box Canyon Surgery Center LLCNGE484-Mountai567-7Maryvil9819469Bl295269JohnsieModen289 42JamaLogansport State HospitOhio Eye AsL830-(334)10N6PrincessZThomasene Lo40933West OkobojiBrSa29Katrinka Blazi4 ng320-3Aurora 6758Kossuth County HospitalNGE410-501 8Henders98125 St67Bl295269JohnsieModen(949) 76JamaMile Square Surgery Center IMarshfield CliL907-609-48N6PrincessZThomasene Lo40947SwedonaMerrSa351Katrinka Blazi5 ng782-1Prairie Community66Northern Light A R Gould HospitalNGE973-Fros(432)3Stamfo9817983 NW. Che60Bl295269JohnsieModen619-44JamaSouth Pointe Surgical CentMilbank Area Hospital / L([phone] Lo40964La MaderaEl Valle de ArroySa37Katrinka Blazi ng208Barstow Community3448Geneva Woods Surgical Center IncNGE(774)Rive850-3Beatri1Bl295269JohnsieModen520-44JamaLighthouse Care Center Of Conway Acute CaPhiladeLPhia Va MeL574-361-35N6PrincessZThomasene Lo40927Wekiwa SpringsSul1Sa253Katrinka Blazi1 ng782-8Ascension Ne Wisconsin St. Elizabeth66Star View Adolescent - P H FNGE937(352) 5Tab98145 North Br29Bl295269JohnsieModen602-102JamaFulton Medical CentRiverside County Regional Medical CentL([phone] Lo40911PlayitaGranSa641Katrinka Blazi ng571-7Columbia Memorial5661Mercy Rehabilitation ServicesNGE775-Weston(229)6Warn981126Bl295269JohnsieModen(314) 81JamaSt Peters ANorthern Idaho Advanced CL218 806-20N6PrincessZThomasene Lo40924StaytonOakSa67Katrinka Blazi4 ng620Indiana University Health Bloomington5148RandoLPh HospitalNGE(251) Pa212 8Worla98165Bl295269JohnsieModen(843)2JamaNorth Orange County Surgery CentWatertown RegionalL775 951-46N6PrincessZThomasene Lo40953WoodwardASa543Katrinka Blazi1 ng(229)2Encompass Health Rehabilitation62662Lewis County General HospitalNGE908-Pine (978)3South Cre981640 SE. Ind38Bl295269JohnsieModen612-50JamaCanton Eye Surgery CentSsm St. Joseph Health CenteL856 906-38N6PrincessZThomasene Lo40969BallarSa826Katrinka Blazi1 ng905-7Mason Ridge Ambulatory Surgery Center Dba Gateway Endosco1936New Hanover Regional Medical Center Orthopedic HospitalNGE(402)D(646) 0Dalton Garde98144 Cob70Bl295269JohnsieModen269-5JamaMississippi Eye Surgery CentFreestone MeL(843)[phone] Lo40956Port AlexanderColumbus JSa117Katrinka Blazi3 ng(541)0Coast Plaza Doctors627The Endoscopy Center LibertyNGE860-385-0Newingt98163Bl295269JohnsieModen838-104JamaRefugio County Memorial Hospital DistriWinchester Eye SurgerL([phone]) 80N6PrincessZThomasene Lo40959MatoakaPatSa613Katrinka Blazi2 ng778-0[facility] - Psychiatric64647Baylor Emergency Medical CenterNGE(414)Ch709-5Whitefie9819322Bl295269JohnsieModen531-57JamaEncompass Health Hospital Of Western MaNorthwest SL843 302-33N6PrincessZThomasene Lo40921Bull RunISa77Katrinka Blazi4 ng361 0Palos Health Surg53e67Unitypoint Health MarshalltownNGE516 C403-2Burlingt9819106 N. P16Bl295269JohnsieModen(430)75JamaKensington HospitMission Regional MeL(315) 267-53N6PrincessZThomasene Lo40914EllenboroCrownSa626Katrinka Blazi3 ng332Brownsville Doctors1260Endoscopic Imaging CenterNGE47Sal813-6Manhatt98136Bl295269JohnsieModen740-51JamaOchsner Extended Care Hospital Of KennLos Angeles Metropolitan MeL419-262-53N6PrincessZThomasene Lo40963Gold CanyonMaSa472Katrinka Blazi7 ng431-2Trace Regional39243Allen Parish HospitalNGE760-E412Dentsvil981168 Mi37Bl295269JohnsieModen561-45JamaSelect Specialty Hospital Warren CampChesapeake Eye SurgerL([phone]) 75N6PrincessZThomasene Lo40930HollisterAleSa1540Katrinka Blazi4 ng269-5Lawrence & Memorial674Myrtue Memorial HospitalNGE313-Hind(737) 3Bent98165Bl295269JohnsieModen909-54JamaGrass Valley Surgery CentEncompass Health Rehabilitation Hospital Of NortL([phone] Lo40958MesaK1Sa618Katrinka Blazi5 ng(631) 2[facility] Medic2920Curahealth New OrleansNGE(931) Blac575-6Pierre Pa981110Bl295269JohnsieModen703 69JamaSsm St. Joseph Hospital WeEndosurgical CenteL83479-68N6PrincessZThomasene Lo40948WilliamstoSa657Katrinka Blazi5 ng334Freeman Surgical C51228Endoscopic Ambulatory Specialty Center Of Bay Ridge IncNGE248-Cal(249)8Sharpsbu98160Bl295269JohnsieModen(779)8JamaLawrence Memorial HospitCarondelet St Marys Northwest LLC Dba Carondelet Foothills SuL651-226-52N6PrincessZThomasene Lo40914Spring RidgSa373Katrinka Blazi5 ng732-6Southeast Ohio Surgical S4635Mountain West Medical CenterNGE715-Be670-5Selm9817671 R23Bl295269JohnsieModen669-68JamaEndoscopy Center Of The UpstaJackson MemorL([phone] Lo40914Pink HillS46aKatrinka Blazi7 ng820-4Uchealth Longs Peak Surge6050University General Hospital DallasNGE808-Bric(408)4Nooksa981662Bl295269JohnsieModen765-4JamaWinston Medical CetnPremier Specialty HospitaL([phone])[phone] Lo40928WimaumaSolanaSa477Katrinka Blazi6 ng548-1Whitehall Surge70479Schleicher County Medical CenterNGE(872)317-7Hemlo4Bl295269JohnsieModen782-35JamaUniversity Of M D Upper Chesapeake Medical CentMason City Ambulatory SurgerL858-9251N6PrincessZThomasene Lo40927MartinsburgBellSa842Katrinka Blazi5 ng708Palmetto Surgery C6159Ambulatory Center For Endoscopy LLCNGE617-708-1Springfie98133Bl295269JohnsieModen250-60JamaMadonna Rehabilitation Specialty Hospital OmaParker AdventL514-9786N6PrincessZThomasene Lo40936BlackfooSuSa397Katrinka Blazi28nguel JesterEriee  AROM for clear fluid with FSE  22yo G3P2002 at 39+ for IOL Expect SVD

## 2014-02-17 LAB — CBC
HEMATOCRIT: 36.7 % (ref 36.0–46.0)
Hemoglobin: 11.8 g/dL — ABNORMAL LOW (ref 12.0–15.0)
MCH: 25.1 pg — ABNORMAL LOW (ref 26.0–34.0)
MCHC: 32.2 g/dL (ref 30.0–36.0)
MCV: 78.1 fL (ref 78.0–100.0)
Platelets: 171 10*3/uL (ref 150–400)
RBC: 4.7 MIL/uL (ref 3.87–5.11)
RDW: 15.1 % (ref 11.5–15.5)
WBC: 11.5 10*3/uL — AB (ref 4.0–10.5)

## 2014-02-17 LAB — CCBB MATERNAL DONOR DRAW

## 2014-02-17 MED ORDER — IBUPROFEN 800 MG PO TABS: 800.0000 mg | ORAL_TABLET | Freq: Three times a day (TID) | ORAL | Status: DC | PRN

## 2014-02-17 MED ORDER — PRENATAL MULTIVITAMIN CH: 1.0000 | ORAL_TABLET | Freq: Every day | ORAL | Status: DC

## 2014-02-17 MED ORDER — OXYCODONE-ACETAMINOPHEN 5-325 MG PO TABS: 1.0000 | ORAL_TABLET | Freq: Four times a day (QID) | ORAL | Status: DC | PRN

## 2014-02-17 NOTE — Anesthesia Postprocedure Evaluation (Signed)
Anesthesia Post Note  Patient: Bianca Maxwell  Procedure(s) Performed: * No procedures listed *  Anesthesia type: Epidural  Patient location: Mother/Baby  Post pain: Pain level controlled  Post assessment: Post-op Vital signs reviewed  Last Vitals:  Filed Vitals:   02/17/14 1000  BP: 110/72  Pulse: 76  Temp: 36.4 C  Resp: 18    Post vital signs: Reviewed  Level of consciousness:alert  Complications: No apparent anesthesia complications

## 2014-02-17 NOTE — Lactation Note (Signed)
This note was copied from the chart of Bianca Maxwell. Lactation Consultation Note Experienced BF 1st child for 19 months, and 2nd child for 7 months and her milk dried up, and doesn't know why. Discussed possible reasons why it could had dried up, mom stated she was feeding every 3-4 hrs, then got to where she wasn't producing anything. Discussed breast massage and stimulation during feedings, positions. Mom said she got a breast infection both times. Asked mom did she feed in the same position all the time, stated yes and didn't massage her breast. Encouraged to change positions to prevent getting clogged ducts, and massage would help empty her breast more. Mom encouraged to feed baby 8-12 times/24 hours and with feeding cues. Mom encouraged to feed baby w/feeding cuesReviewed Baby & Me book's Breastfeeding Basics. Mom reports + breast changes w/pregnancy. Encouraged to call for assistance if needed and to verify proper latch.WH/LC brochure given w/resources, support groups and LC services. Encouraged to [facility] after [facility]/c home if she has questions or concerns. Asked to call for next feeding to assist and monitor latch. Patient Name: Bianca Senaida Langeshley Orris ZOXWR'UToday's Date: 02/17/2014     Maternal Data    Feeding Feeding Type: Breast Fed Length of feed: 10 min  LATCH Score/Interventions                      Lactation Tools Discussed/Used     Consult Status      Bianca Maxwell 02/17/2014, 1:13 PM

## 2014-02-17 NOTE — Discharge Summary (Signed)
Obstetric Discharge Summary Reason for Admission: induction of labor Prenatal Procedures: none Intrapartum Procedures: spontaneous vaginal delivery Postpartum Procedures: none Complications-Operative and Postpartum: none Hemoglobin  Date Value Ref Range Status  02/17/2014 11.8* 12.0 - 15.0 g/dL Final     HCT  Date Value Ref Range Status  02/17/2014 36.7  36.0 - 46.0 % Final    Physical Exam:  General: alert and no distress Lochia: appropriate Uterine Fundus: firm  Discharge Diagnoses: Term Pregnancy-delivered  Discharge Information: Date: 02/17/2014 Activity: pelvic rest Diet: routine Medications: PNV, Ibuprofen and Percocet Condition: stable Instructions: refer to practice specific booklet Discharge to: home Follow-up Information   Follow up with Bovard-Stuckert, Augusto GambleJody, MD In 6 weeks. (for post-partum check)    Specialty:  Obstetrics and Gynecology   Contact information:   [address] [place] [address] [phone]       Newborn Data: Live born female  Birth Weight: 10 lb 0.2 oz (4542 g) APGAR: 9, 9  Home with mother.  Bovard-Stuckert, Arwa Yero 02/17/2014, 8:36 AM

## 2014-02-17 NOTE — Progress Notes (Addendum)
Post Partum Day 1 Subjective: no complaints, up ad lib, voiding, tolerating PO and nl lochia, pain controlled  Objective: Blood pressure 132/71, pulse 59, temperature 97.9 F (36.6 C), temperature source Oral, resp. rate 20, height 5' 5.5" (1.664 m), weight 116.574 kg (257 lb), last menstrual period 05/25/2013, SpO2 99.00%, unknown if currently breastfeeding.  Physical Exam:  General: alert and no distress Lochia: appropriate Uterine Fundus: firm  Recent Labs  02/16/14 0810 02/17/14 0545  HGB 12.4 11.8*  HCT 38.1 36.7    Assessment/Plan: Plan for discharge tomorrow, Breastfeeding and Lactation consult.  Routine care. Pt desires d/c to home, if OK per nursery - will d/c with Motrin, percocet, and PNV.  F/u 6 weeks.      LOS: 1 day   Bovard-Stuckert, Bird Tailor 02/17/2014, 8:15 AM

## 2014-02-17 NOTE — Lactation Note (Signed)
This note was copied from the chart of Bianca Senaida Langeshley Maiolo. Lactation Consultation Note Had asked mom to call for next feeding, noted hadn't BF, mom stated baby was sleepy and couldn't wake her. Rm. Warm, unwrapped baby, BF STS. Mom stated baby had been gagy and occassionally spity. Noted slight abd. Distention. Mom stated baby had been passing gas. Baby had large void. Encouraged to stimulate baby to feed, burping baby. Noted upper lip labial frenulum to gum line. Moves tongue well, note a blue vein under baby's tongue. Reported to RN and nursery. Baby has good rhythm suckling. Latched well.  Patient Name: Bianca Maxwell WNUUV'OToday's Date: 02/17/2014 Reason for consult: Follow-up assessment   Maternal Data    Feeding Feeding Type: Breast Fed Length of feed: 15 min (still feeding)  LATCH Score/Interventions Latch: Grasps breast easily, tongue down, lips flanged, rhythmical sucking.  Audible Swallowing: A few with stimulation Intervention(s): Skin to skin;Hand expression Intervention(s): Hand expression;Skin to skin  Type of Nipple: Everted at rest and after stimulation  Comfort (Breast/Nipple): Filling, red/small blisters or bruises, mild/mod discomfort  Problem noted: Mild/Moderate discomfort Interventions (Mild/moderate discomfort): Comfort gels;Post-pump;Hand massage;Hand expression  Hold (Positioning): Assistance needed to correctly position infant at breast and maintain latch. Intervention(s): Breastfeeding basics reviewed;Support Pillows;Position options;Skin to skin  LATCH Score: 7  Lactation Tools Discussed/Used Tools: Pump;Comfort gels Breast pump type: Manual   Consult Status Consult Status: Follow-up Date: 02/18/14 Follow-up type: In-patient    Charyl DancerCARVER, [name] 02/17/2014, 2:48 PM

## 2014-03-15 ENCOUNTER — Emergency Department (HOSPITAL_BASED_OUTPATIENT_CLINIC_OR_DEPARTMENT_OTHER)
Admission: EM | Admit: 2014-03-15 | Discharge: 2014-03-15 | Disposition: A | Discharge status: Home / Self Care | Payer: Managed Care, Other (non HMO) | Attending: Emergency Medicine | Admitting: Emergency Medicine

## 2014-03-15 ENCOUNTER — Encounter (HOSPITAL_BASED_OUTPATIENT_CLINIC_OR_DEPARTMENT_OTHER): Payer: Self-pay | Admitting: Emergency Medicine

## 2014-03-15 DIAGNOSIS — N189 Chronic kidney disease, unspecified: Secondary | ICD-10-CM | POA: Insufficient documentation

## 2014-03-15 DIAGNOSIS — Z91040 Latex allergy status: Secondary | ICD-10-CM | POA: Diagnosis not present

## 2014-03-15 DIAGNOSIS — K089 Disorder of teeth and supporting structures, unspecified: Secondary | ICD-10-CM | POA: Diagnosis present

## 2014-03-15 DIAGNOSIS — F17200 Nicotine dependence, unspecified, uncomplicated: Secondary | ICD-10-CM | POA: Diagnosis not present

## 2014-03-15 DIAGNOSIS — Z791 Long term (current) use of non-steroidal anti-inflammatories (NSAID): Secondary | ICD-10-CM | POA: Diagnosis not present

## 2014-03-15 DIAGNOSIS — K0889 Other specified disorders of teeth and supporting structures: Secondary | ICD-10-CM

## 2014-03-15 DIAGNOSIS — K029 Dental caries, unspecified: Secondary | ICD-10-CM | POA: Insufficient documentation

## 2014-03-15 MED ORDER — PENICILLIN V POTASSIUM 250 MG PO TABS: 250.0000 mg | ORAL_TABLET | Freq: Four times a day (QID) | ORAL | Status: AC

## 2014-03-15 NOTE — ED Provider Notes (Signed)
CSN: [id number]     Arrival date & time 03/15/14  1340 History   First MD Initiated Contact with Patient 03/15/14 1359     Chief Complaint  Patient presents with  . Dental Pain     (Consider location/radiation/quality/duration/timing/severity/associated sxs/prior Treatment) HPI Comments: Pt c/o right upper dental pain for the last couple of days. Pt states that she knew she had a cavity when she was pregnant, but didn't get it taken care of and when she called the dentist they told her to come here. No fever  The history is provided by the patient. No language interpreter was used.    Past Medical History  Diagnosis Date  . Normal pregnancy in multigravida in third trimester 02/15/2014  . SVD (spontaneous vaginal delivery) 02/16/2014  . Chronic kidney disease    History reviewed. No pertinent past surgical history. No family history on file. History  Substance Use Topics  . Smoking status: Current Some Day Smoker -- 0.50 packs/day    Types: Cigarettes  . Smokeless tobacco: Not on file  . Alcohol Use: No   OB History   Grav Para Term Preterm Abortions TAB SAB Ect Mult Living   4 3 3       3      Review of Systems  Constitutional: Negative.   Respiratory: Negative.   Cardiovascular: Negative.       Allergies  Latex  Home Medications   Prior to Admission medications   Medication Sig Start Date End Date Taking? Authorizing Provider  acetaminophen (TYLENOL) 500 MG tablet Take 500 mg by mouth once.    Historical Provider, MD  ibuprofen (ADVIL,MOTRIN) 800 MG tablet Take 1 tablet (800 mg total) by mouth every 8 (eight) hours as needed. 02/17/14   Sherian ReinJody Bovard-Stuckert, MD  oxyCODONE-acetaminophen (PERCOCET/ROXICET) 5-325 MG per tablet Take 1-2 tablets by mouth every 6 (six) hours as needed for severe pain (moderate - severe pain). 02/17/14   Sherian ReinJody Bovard-Stuckert, MD  penicillin v potassium (VEETID) 250 MG tablet Take 1 tablet (250 mg total) by mouth 4 (four) times daily.  03/15/14 03/22/14  Teressa LowerVrinda Rhia Blatchford, NP  Prenatal Vit-Fe Fumarate-FA (PRENATAL MULTIVITAMIN) TABS tablet Take 1 tablet by mouth daily at 12 noon. 02/17/14   Jody Bovard-Stuckert, MD   BP 116/86  Pulse 63  Temp(Src) 98 F (36.7 C) (Oral)  Resp 16  Ht 5\' 6"  (1.676 m)  Wt 200 lb (90.719 kg)  BMI 32.30 kg/m2  SpO2 100%  LMP 05/25/2013  Breastfeeding? Yes Physical Exam  Nursing note and vitals reviewed. Constitutional: She appears well-developed and well-nourished.  HENT:  Right Ear: External ear normal.  Left Ear: External ear normal.  Mouth/Throat:    Decay noted. Mild swelling to the cheek  Cardiovascular: Normal rate and regular rhythm.   Pulmonary/Chest: Effort normal and breath sounds normal.  Musculoskeletal: Normal range of motion.    ED Course  Procedures (including critical care time) Labs Review Labs Reviewed - No data to display  Imaging Review No results found.   EKG Interpretation None      MDM   Final diagnoses:  Toothache    Will treat with antibiotics:    Teressa LowerVrinda Kham Zuckerman, NP 03/15/14 1425

## 2014-03-15 NOTE — ED Notes (Signed)
Reports pain to upper right tooth. Noticeable swelling to right side of face. Attempted to contact dentist but not financially able at this time. Pt is breastfeeding.

## 2014-03-15 NOTE — Discharge Instructions (Signed)

## 2014-03-19 NOTE — ED Provider Notes (Signed)
Medical screening examination/treatment/procedure(s) were performed by non-physician practitioner and as supervising physician I was immediately available for consultation/collaboration.   Silvano Garofano L Mistie Adney, MD 03/19/14 1121 

## 2014-04-07 ENCOUNTER — Other Ambulatory Visit: Payer: Self-pay | Admitting: Obstetrics and Gynecology

## 2014-04-07 DIAGNOSIS — N6320 Unspecified lump in the left breast, unspecified quadrant: Secondary | ICD-10-CM

## 2014-04-07 DIAGNOSIS — N6310 Unspecified lump in the right breast, unspecified quadrant: Secondary | ICD-10-CM

## 2014-04-10 ENCOUNTER — Encounter (INDEPENDENT_AMBULATORY_CARE_PROVIDER_SITE_OTHER): Payer: Self-pay

## 2014-04-10 ENCOUNTER — Ambulatory Visit
Admission: RE | Admit: 2014-04-10 | Discharge: 2014-04-10 | Disposition: A | Discharge status: Home / Self Care | Payer: Managed Care, Other (non HMO) | Source: Ambulatory Visit | Attending: Obstetrics and Gynecology | Admitting: Obstetrics and Gynecology

## 2014-04-10 DIAGNOSIS — N6310 Unspecified lump in the right breast, unspecified quadrant: Secondary | ICD-10-CM

## 2014-04-10 DIAGNOSIS — N6320 Unspecified lump in the left breast, unspecified quadrant: Secondary | ICD-10-CM

## 2014-05-29 ENCOUNTER — Encounter (HOSPITAL_BASED_OUTPATIENT_CLINIC_OR_DEPARTMENT_OTHER): Payer: Self-pay | Admitting: Emergency Medicine

## 2015-11-13 ENCOUNTER — Other Ambulatory Visit: Payer: Self-pay | Admitting: Obstetrics and Gynecology

## 2015-11-13 DIAGNOSIS — N63 Unspecified lump in unspecified breast: Secondary | ICD-10-CM

## 2015-11-16 ENCOUNTER — Other Ambulatory Visit: Payer: Self-pay | Admitting: Obstetrics and Gynecology

## 2015-11-16 ENCOUNTER — Ambulatory Visit
Admission: RE | Admit: 2015-11-16 | Discharge: 2015-11-16 | Disposition: A | Discharge status: Home / Self Care | Payer: BLUE CROSS/BLUE SHIELD | Source: Ambulatory Visit | Attending: Obstetrics and Gynecology | Admitting: Obstetrics and Gynecology

## 2015-11-16 DIAGNOSIS — N6310 Unspecified lump in the right breast, unspecified quadrant: Secondary | ICD-10-CM

## 2015-11-16 DIAGNOSIS — N63 Unspecified lump in unspecified breast: Secondary | ICD-10-CM

## 2016-02-09 ENCOUNTER — Ambulatory Visit (INDEPENDENT_AMBULATORY_CARE_PROVIDER_SITE_OTHER): Payer: BLUE CROSS/BLUE SHIELD | Admitting: Emergency Medicine

## 2016-02-09 VITALS — BP 98/64 | HR 104 | Temp 97.6°F | Resp 20 | Ht 64.0 in | Wt 275.5 lb

## 2016-02-09 DIAGNOSIS — H6000 Abscess of external ear, unspecified ear: Secondary | ICD-10-CM | POA: Diagnosis not present

## 2016-02-09 MED ORDER — OFLOXACIN 0.3 % OT SOLN: 10.0000 [drp] | Freq: Every day | OTIC | Status: DC

## 2016-02-09 MED ORDER — DOXYCYCLINE HYCLATE 100 MG PO TABS: 100.0000 mg | ORAL_TABLET | Freq: Two times a day (BID) | ORAL | Status: DC

## 2016-02-09 NOTE — Patient Instructions (Signed)
     IF you received an x-ray today, you will receive an invoice from [facility]. Please contact [facility] at [phone] with questions or concerns regarding your invoice.   IF you received labwork today, you will receive an invoice from [facility]. Please contact [facility] at [phone] with questions or concerns regarding your invoice.   Our billing staff will not be able to assist you with questions regarding bills from these companies.  You will be contacted with the lab results as soon as they are available. The fastest way to get your results is to activate your My Chart account. Instructions are located on the last page of this paperwork. If you have not heard from us regarding the results in 2 weeks, please contact this office.      

## 2016-02-09 NOTE — Progress Notes (Addendum)
By signing my name below, I, Stann Oresung-Kai Tsai, attest that this documentation has been prepared under the direction and in the presence of Lesle ChrisSteven Daub, MD. Electronically Signed: Stann Oresung-Kai Tsai, Scribe. 02/09/2016 , 12:13 PM .  Patient was seen in room 7 .  Chief Complaint:  Chief Complaint  Patient presents with  . Ear Fullness    left    HPI: Bianca Maxwell is a 25 y.o. female [facility] complaining of left ear fullness that started past 2~3 days. Patient states her left ear has been feeling swollen and painful. She states that it hurts to touch her ear and it also hurts when she's sleeping. She tried putting a Q-tip in her ear without anything coming out. She's been trying sweet oil drops and also a drop of her own urine (after her own research). She did not notice any drainage from her ear prior to her visit today.   She denies possibility of pregnancy. She is on birth control pills.   Past Medical History  Diagnosis Date  . Normal pregnancy in multigravida in third trimester 02/15/2014  . SVD (spontaneous vaginal delivery) 02/16/2014  . Chronic kidney disease    History reviewed. No pertinent past surgical history. Social History   Social History  . Marital Status: Married    Spouse Name: N/A  . Number of Children: N/A  . Years of Education: N/A   Social History Main Topics  . Smoking status: Current Some Day Smoker -- 0.50 packs/day    Types: Cigarettes  . Smokeless tobacco: None  . Alcohol Use: No  . Drug Use: No  . Sexual Activity: No   Other Topics Concern  . None   Social History Narrative   History reviewed. No pertinent family history. Allergies  Allergen Reactions  . Latex Itching   Prior to Admission medications   Medication Sig Start Date End Date Taking? Authorizing Provider  acetaminophen (TYLENOL) 500 MG tablet Take 500 mg by mouth once.   Yes Historical Provider, MD  ibuprofen (ADVIL,MOTRIN) 800 MG tablet Take 1 tablet (800 mg  total) by mouth every 8 (eight) hours as needed. 02/17/14  Yes Jody Bovard-Stuckert, MD     ROS:  Constitutional: negative for fever, chills, night sweats, weight changes, or fatigue  HEENT: negative for vision changes, hearing loss, congestion, rhinorrhea, ST, epistaxis, or sinus pressure; positive for ear pain (left)  Cardiovascular: negative for chest pain or palpitations Respiratory: negative for hemoptysis, wheezing, shortness of breath, or cough Abdominal: negative for abdominal pain, nausea, vomiting, diarrhea, or constipation Dermatological: negative for rash Neurologic: negative for headache, dizziness, or syncope All other systems reviewed and are otherwise negative with the exception to those above and in the HPI.  PHYSICAL EXAM: Filed Vitals:   02/09/16 1147  BP: 98/64  Pulse: 104  Temp: 97.6 F (36.4 C)  Resp: 20   Body mass index is 47.27 kg/(m^2).   General: alert, cooperative HEENT:  Normocephalic, atraumatic, oropharynx patent; right ear normal; Left ear: has a draining furuncle with purulent material filling the canal, ear drum poorly seen [facility] Cardiovascular:  Regular rate and rhythm, no rubs murmurs or gallops.  No Carotid bruits, radial pulse intact. No pedal edema.  Respiratory: Clear to auscultation bilaterally.  No wheezes, rales, or rhonchi.  No cyanosis, no use of accessory musculature Abdominal: No organomegaly, abdomen is soft and non-tender, positive bowel sounds. No masses. Musculoskeletal: Gait intact. No edema, tenderness Skin: No rashes. Neurologic:  Facial musculature symmetric. Psychiatric: Patient acts appropriately throughout our interaction.  Lymphatic: No cervical or submandibular lymphadenopathy Genitourinary/Anorectal: No acute findings  LABS:   EKG/XRAY:     ASSESSMENT/PLAN: There is purulent drainage in the canal. There is a firm rhonchal on the roof of the auditory canal. This was sent for culture. She will be on  Floxin otic drops along with doxycycline to cover for MRSA.I personally performed the services described in this documentation, which was scribed in my presence. The recorded information has been reviewed and is accurate. Patient states there is no possibility she is pregnant and understands she cannot get pregnant on the antibiotic she was given.  Gross sideeffects, risk and benefits, and alternatives of medications d/w patient. Patient is aware that all medications have potential sideeffects and we are unable to predict every sideeffect or drug-drug interaction that may occur.  Lesle Chris MD 02/09/2016 12:01 PM

## 2016-02-12 LAB — WOUND CULTURE

## 2016-02-15 ENCOUNTER — Telehealth: Payer: Self-pay | Admitting: Emergency Medicine

## 2016-02-15 ENCOUNTER — Telehealth: Payer: Self-pay

## 2016-02-15 NOTE — Telephone Encounter (Signed)
Pt. Returned Jill's call, I did inform her of her lab results

## 2016-02-15 NOTE — Telephone Encounter (Signed)
-----   Message from Collene GobbleSteven A Daub, MD sent at 02/12/2016  8:22 AM EDT ----- Call patient. She does have MRSA in her ear. Make sure she takes all of her antibiotics. Make sure we see her in follow-up to be sure the infection has cleared.

## 2016-04-01 ENCOUNTER — Emergency Department (HOSPITAL_BASED_OUTPATIENT_CLINIC_OR_DEPARTMENT_OTHER)
Admission: EM | Admit: 2016-04-01 | Discharge: 2016-04-01 | Disposition: A | Discharge status: Home / Self Care | Payer: BLUE CROSS/BLUE SHIELD | Attending: Emergency Medicine | Admitting: Emergency Medicine

## 2016-04-01 ENCOUNTER — Encounter (HOSPITAL_BASED_OUTPATIENT_CLINIC_OR_DEPARTMENT_OTHER): Payer: Self-pay | Admitting: Emergency Medicine

## 2016-04-01 ENCOUNTER — Emergency Department (HOSPITAL_BASED_OUTPATIENT_CLINIC_OR_DEPARTMENT_OTHER): Payer: BLUE CROSS/BLUE SHIELD

## 2016-04-01 DIAGNOSIS — W010XXA Fall on same level from slipping, tripping and stumbling without subsequent striking against object, initial encounter: Secondary | ICD-10-CM | POA: Diagnosis not present

## 2016-04-01 DIAGNOSIS — S99912A Unspecified injury of left ankle, initial encounter: Secondary | ICD-10-CM | POA: Diagnosis present

## 2016-04-01 DIAGNOSIS — Y929 Unspecified place or not applicable: Secondary | ICD-10-CM | POA: Diagnosis not present

## 2016-04-01 DIAGNOSIS — T1490XA Injury, unspecified, initial encounter: Secondary | ICD-10-CM

## 2016-04-01 DIAGNOSIS — S93402A Sprain of unspecified ligament of left ankle, initial encounter: Secondary | ICD-10-CM

## 2016-04-01 DIAGNOSIS — N189 Chronic kidney disease, unspecified: Secondary | ICD-10-CM | POA: Diagnosis not present

## 2016-04-01 DIAGNOSIS — F17210 Nicotine dependence, cigarettes, uncomplicated: Secondary | ICD-10-CM | POA: Insufficient documentation

## 2016-04-01 DIAGNOSIS — Y9302 Activity, running: Secondary | ICD-10-CM | POA: Diagnosis not present

## 2016-04-01 DIAGNOSIS — M25572 Pain in left ankle and joints of left foot: Secondary | ICD-10-CM

## 2016-04-01 DIAGNOSIS — Y999 Unspecified external cause status: Secondary | ICD-10-CM | POA: Insufficient documentation

## 2016-04-01 NOTE — Discharge Instructions (Signed)
Elevate and ice your ankle for the next 48 hours. Wear the brace at all times except to shower and while sleeping. Follow-up with your primary care provider or Dr. Victorino DikeHewitt, orthopedics, in 3-4 days if your symptoms are not improving. Take ibuprofen as needed for pain.  Return to emergency department if you experience worsening pain, worsening swelling of your foot, blueness in your toes, numbness/timing, or weakness, or any other concerning symptoms.

## 2016-04-01 NOTE — ED Notes (Signed)
Pt in xray

## 2016-04-01 NOTE — ED Provider Notes (Signed)
[facility] Note   CSN: [id number] Arrival date & time: 04/01/16  1935  By signing my name below, I, Bianca Maxwell, attest that this documentation has been prepared under the direction and in the presence of Bianca Marlin, PA-C. Electronically signed by: Bianca Maxwell, ED Scribe. 04/01/16. 9:01 PM.  History   Chief Complaint Chief Complaint  Patient presents with  . Ankle Injury   The history is provided by the patient. No language interpreter was used.   HPI Comments:  Bianca Maxwell is a 25 y.o. female who presents to the Emergency Department complaining of left ankle and foot injury status post fall, which occurred 4 hours ago. Pt reports that she tripped in a pot hole while running and heard her ankle crack. Pain characterized as burning, throbbing constant pain that radiated up lateral left leg. No associated symptoms reported. She took approximately 800 mg of Ibuprofen prior to arrival, which has improved symptoms. Denies swelling, numbness, tingling or head injury.  Past Medical History:  Diagnosis Date  . Chronic kidney disease   . Normal pregnancy in multigravida in third trimester 02/15/2014  . SVD (spontaneous vaginal delivery) 02/16/2014    Patient Active Problem List   Diagnosis Date Noted  . Supervision of normal pregnancy in third trimester 02/16/2014  . SVD (spontaneous vaginal delivery) 02/16/2014  . Normal pregnancy in multigravida in third trimester 02/15/2014    History reviewed. No pertinent surgical history.  OB History    Gravida Para Term Preterm AB Living   4 3 3     3    SAB TAB Ectopic Multiple Live Births           3       Home Medications    Prior to Admission medications   Medication Sig Start Date End Date Taking? Authorizing Provider  acetaminophen (TYLENOL) 500 MG tablet Take 500 mg by mouth once.    Historical Provider, MD  doxycycline (VIBRA-TABS) 100 MG tablet Take 1 tablet (100 mg total) by mouth 2 (two) times daily.  02/09/16   Bianca Gobble, MD  ibuprofen (ADVIL,MOTRIN) 800 MG tablet Take 1 tablet (800 mg total) by mouth every 8 (eight) hours as needed. 02/17/14   Bianca Bovard-Stuckert, MD  ofloxacin (FLOXIN) 0.3 % otic solution Place 10 drops into the left ear daily. 02/09/16   Bianca Gobble, MD    Family History No family history on file.  Social History Social History  Substance Use Topics  . Smoking status: Current Some Day Smoker    Packs/day: 0.50    Types: Cigarettes  . Smokeless tobacco: Never Used  . Alcohol use No     Allergies   Latex   Review of Systems Review of Systems  Musculoskeletal: Positive for arthralgias and myalgias. Negative for joint swelling.  Neurological: Negative for syncope and numbness.     Physical Exam Updated Vital Signs BP 115/70 (BP Location: Right Arm)   Pulse 83   Temp 98.1 F (36.7 C) (Oral)   Resp 18   Ht 5\' 5"  (1.651 m)   Wt 275 lb (124.7 kg)   SpO2 98%   BMI 45.76 kg/m   Physical Exam  Constitutional: She appears well-developed and well-nourished. No distress.  HENT:  Head: Normocephalic and atraumatic.  Eyes: Conjunctivae are normal.  Cardiovascular:  Pulses:      Dorsalis pedis pulses are 2+ on the right side, and 2+ on the left side.  Pulmonary/Chest: Effort normal. No respiratory distress.  Musculoskeletal: Normal range of motion. She exhibits tenderness. She exhibits no edema or deformity.  Full ROM in left ankle, no pain with inversion or eversion. Mild edema noted over lateral malleoli. No ecchymosis, edema or deformities. Sensation intact distally. 2+ DP pulses. Tingling sensation inferior to lateral malleoli. TTP to lateral malleoli. No TTP to proximal tib/fib. Patient neurovascularly intact distally  Neurological: She is alert. She has normal reflexes. Coordination normal.  Skin: Skin is warm and dry. She is not diaphoretic.  Psychiatric: She has a normal mood and affect. Her behavior is normal.  Nursing note and  vitals reviewed.    ED Treatments / Results  DIAGNOSTIC STUDIES:  Oxygen Saturation is 98% on room air, normal by my interpretation.    COORDINATION OF CARE:  8:53 PM Discussed treatment plan with pt at bedside, which includes ice, Ibuprofen and ankle brace, and pt agreed to plan. Advised to move ankle and follow up with PCP in 3 days if sxs do not improve. Pt should return to ED if sxs worsen.  Labs (all labs ordered are listed, but only abnormal results are displayed) Labs Reviewed - No data to display  EKG  EKG Interpretation None       Radiology Dg Ankle Complete Left  Result Date: 04/01/2016 CLINICAL DATA:  25 year old female with fall and trauma to the left foot and left ankle. EXAM: LEFT ANKLE COMPLETE - 3+ VIEW; LEFT FOOT - COMPLETE 3+ VIEW COMPARISON:  None. FINDINGS: There is no acute fracture or dislocation. The bones are well mineralized. The ankle mortise is intact. There is no arthritic changes. The soft tissues appear unremarkable. No radiopaque foreign object. IMPRESSION: Negative. Electronically Signed   By: Bianca Maxwell M.D.   On: 04/01/2016 20:28   Dg Foot Complete Left  Result Date: 04/01/2016 CLINICAL DATA:  25 year old female with fall and trauma to the left foot and left ankle. EXAM: LEFT ANKLE COMPLETE - 3+ VIEW; LEFT FOOT - COMPLETE 3+ VIEW COMPARISON:  None. FINDINGS: There is no acute fracture or dislocation. The bones are well mineralized. The ankle mortise is intact. There is no arthritic changes. The soft tissues appear unremarkable. No radiopaque foreign object. IMPRESSION: Negative. Electronically Signed   By: Bianca Maxwell M.D.   On: 04/01/2016 20:28    Procedures Procedures (including critical care time)  Medications Ordered in ED Medications - No data to display   Initial Impression / Assessment and Plan / ED Course  I have reviewed the triage vital signs and the nursing notes.  Pertinent labs & imaging results that were available  during my care of the patient were reviewed by me and considered in my medical decision making (see chart for details).  Clinical Course   Patient with left Ankle pain. Presentation concerning for sprain. X-rays reviewed by me revealed no bony abnormality, dislocation, or fracture. Patient is neurovascularly intact distally and able to move ankle although states it is painful. Patient given ankle brace and crutches in the ED. Discussed RICE and pain medication. Instructed patient to follow up with their primary care provider within 2-3 days to have ankle reevaluated. Discussed strict return precautions to the ED. patient appears reliable and expressed understanding to the discharge instructions.   I personally performed the services described in this documentation, which was scribed in my presence. The recorded information has been reviewed and is accurate.   Final Clinical Impressions(s) / ED Diagnoses   Final diagnoses:  Left ankle pain  Left ankle sprain, initial encounter  New Prescriptions Discharge Medication List as of 04/01/2016  9:09 PM        Jerre SimonJessica L Shaiann Mcmanamon, PA 04/02/16 0131    Derwood KaplanAnkit Nanavati, MD 04/07/16 [phone]

## 2016-04-01 NOTE — ED Triage Notes (Signed)
Pt c/o left ankle and foot pain after tripping in a pot hole while running and fell today

## 2016-05-15 ENCOUNTER — Emergency Department (HOSPITAL_BASED_OUTPATIENT_CLINIC_OR_DEPARTMENT_OTHER): Payer: BLUE CROSS/BLUE SHIELD

## 2016-05-15 ENCOUNTER — Encounter (HOSPITAL_BASED_OUTPATIENT_CLINIC_OR_DEPARTMENT_OTHER): Payer: Self-pay | Admitting: *Deleted

## 2016-05-15 ENCOUNTER — Emergency Department (HOSPITAL_BASED_OUTPATIENT_CLINIC_OR_DEPARTMENT_OTHER)
Admission: EM | Admit: 2016-05-15 | Discharge: 2016-05-15 | Disposition: A | Discharge status: Home / Self Care | Payer: BLUE CROSS/BLUE SHIELD | Attending: Emergency Medicine | Admitting: Emergency Medicine

## 2016-05-15 DIAGNOSIS — Z791 Long term (current) use of non-steroidal anti-inflammatories (NSAID): Secondary | ICD-10-CM | POA: Insufficient documentation

## 2016-05-15 DIAGNOSIS — Z79891 Long term (current) use of opiate analgesic: Secondary | ICD-10-CM | POA: Diagnosis not present

## 2016-05-15 DIAGNOSIS — R51 Headache: Secondary | ICD-10-CM | POA: Insufficient documentation

## 2016-05-15 DIAGNOSIS — F17210 Nicotine dependence, cigarettes, uncomplicated: Secondary | ICD-10-CM | POA: Diagnosis not present

## 2016-05-15 DIAGNOSIS — R519 Headache, unspecified: Secondary | ICD-10-CM

## 2016-05-15 DIAGNOSIS — N189 Chronic kidney disease, unspecified: Secondary | ICD-10-CM | POA: Diagnosis not present

## 2016-05-15 LAB — BASIC METABOLIC PANEL
ANION GAP: 7 (ref 5–15)
BUN: 13 mg/dL (ref 6–20)
CO2: 25 mmol/L (ref 22–32)
Calcium: 8.9 mg/dL (ref 8.9–10.3)
Chloride: 108 mmol/L (ref 101–111)
Creatinine, Ser: 0.82 mg/dL (ref 0.44–1.00)
GLUCOSE: 84 mg/dL (ref 65–99)
POTASSIUM: 3.6 mmol/L (ref 3.5–5.1)
Sodium: 140 mmol/L (ref 135–145)

## 2016-05-15 LAB — PREGNANCY, URINE: Preg Test, Ur: NEGATIVE

## 2016-05-15 MED ORDER — DIPHENHYDRAMINE HCL 50 MG/ML IJ SOLN: 25.0000 mg | Freq: Once | INTRAMUSCULAR | Status: DC

## 2016-05-15 MED ORDER — METOCLOPRAMIDE HCL 5 MG/ML IJ SOLN: 10.0000 mg | Freq: Once | INTRAMUSCULAR | Status: DC

## 2016-05-15 MED ORDER — KETOROLAC TROMETHAMINE 30 MG/ML IJ SOLN
30.0000 mg | Freq: Once | INTRAMUSCULAR | Status: AC
  Administered 2016-05-15: 30 mg via INTRAVENOUS
  Filled 2016-05-15: qty 1

## 2016-05-15 MED ORDER — SODIUM CHLORIDE 0.9 % IV BOLUS (SEPSIS)
1000.0000 mL | Freq: Once | INTRAVENOUS | Status: AC
  Administered 2016-05-15: 1000 mL via INTRAVENOUS

## 2016-05-15 NOTE — ED Notes (Signed)
Pt. Still reports headache.

## 2016-05-15 NOTE — ED Provider Notes (Signed)
[facility]   CSN: [id number] Arrival date & time: 05/15/16  1612  By signing my name below, I, Emmanuella Mensah, attest that this documentation has been prepared under the direction and in the presence of Demetrios Loll, PA-C. Electronically Signed: Angelene Giovanni, ED Scribe. 05/15/16. 4:47 PM.   History   Chief Complaint Chief Complaint  Patient presents with  . Headache    HPI Comments: Bianca Maxwell is a 25 y.o. female who presents to the Emergency Department complaining of gradual onset, persistent 5/10 left lateral headache she describes as throbbing onset 2 days ago. States that is starts in the upper part of the neck that radiates to the scalp and is burning in nature. She notes that the headache was maximal at onset and the pain worsens with movement of her head to the left. She reports associated intermittent moderate sharp pain to her left lateral neck but states that her neck pain is not related to her headache. She adds that 3 days ago, she had one episode where she could see an "L" out of her right eye with blurred vision while at the grocery store but that episode resolved on its own after 15 minutes. No alleviating factors noted. She states that she has tried Ibuprofen and Tylenol with no relief. Pt has an allergy to Latex. She denies any recent head injuries, trauma, or heavy lifting. She also denies a hx of migraines or hypertension. She states that she has persistent moderate left ankle pain with swelling after a past injury. She explains that she was running when she accidentally stepped into a hole, twisting her ankle at that time. She is currently being seen by a Podiatrist and receives Meloxicam. She denies any fever, chills, photophobia, noise sensitivity, eye pain, back pain, abdominal pain, nausea, vomiting, chest pain, numbness, weakness, or any other symptoms.   The history is provided by the patient. No language interpreter was used.      Past Medical History:  Diagnosis Date  . Chronic kidney disease   . Normal pregnancy in multigravida in third trimester 02/15/2014  . SVD (spontaneous vaginal delivery) 02/16/2014    Patient Active Problem List   Diagnosis Date Noted  . Supervision of normal pregnancy in third trimester 02/16/2014  . SVD (spontaneous vaginal delivery) 02/16/2014  . Normal pregnancy in multigravida in third trimester 02/15/2014    History reviewed. No pertinent surgical history.  OB History    Gravida Para Term Preterm AB Living   4 3 3     3    SAB TAB Ectopic Multiple Live Births           3       Home Medications    Prior to Admission medications   Medication Sig Start Date End Date Taking? Authorizing Provider  acetaminophen (TYLENOL) 500 MG tablet Take 500 mg by mouth once.   Yes Historical Provider, MD  ibuprofen (ADVIL,MOTRIN) 800 MG tablet Take 1 tablet (800 mg total) by mouth every 8 (eight) hours as needed. 02/17/14  Yes Jody Bovard-Stuckert, MD  doxycycline (VIBRA-TABS) 100 MG tablet Take 1 tablet (100 mg total) by mouth 2 (two) times daily. 02/09/16   Collene Gobble, MD  ofloxacin (FLOXIN) 0.3 % otic solution Place 10 drops into the left ear daily. 02/09/16   Collene Gobble, MD    Family History No family history on file.  Social History Social History  Substance Use Topics  . Smoking status: Current Some Day  Smoker    Packs/day: 0.50    Types: Cigarettes  . Smokeless tobacco: Never Used  . Alcohol use No     Allergies   Latex   Review of Systems Review of Systems  Constitutional: Negative for chills, fatigue and fever.  HENT: Negative for congestion and rhinorrhea.   Eyes: Positive for visual disturbance. Negative for photophobia, pain and discharge.  Respiratory: Negative for cough and shortness of breath.   Cardiovascular: Negative for chest pain and palpitations.  Gastrointestinal: Negative for abdominal pain, nausea and vomiting.  Musculoskeletal: Negative  for back pain and gait problem.  Skin: Negative.   Neurological: Positive for headaches. Negative for dizziness, syncope, weakness and numbness.  All other systems reviewed and are negative.    Physical Exam Updated Vital Signs BP 129/87 (BP Location: Right Arm)   Pulse 84   Temp 98.3 F (36.8 C) (Oral)   Resp 18   Ht 5\' 4"  (1.626 m)   Wt 270 lb (122.5 kg)   SpO2 100%   BMI 46.35 kg/m   Physical Exam  Constitutional: She is oriented to person, place, and time. She appears well-developed and well-nourished. No distress.  HENT:  Head: Normocephalic and atraumatic.  Right Ear: Hearing, tympanic membrane, external ear and ear canal normal.  Left Ear: Hearing, tympanic membrane, external ear and ear canal normal.  Nose: Nose normal.  Mouth/Throat: Uvula is midline, oropharynx is clear and moist and mucous membranes are normal.  No temporal artery tenderness  Eyes: Conjunctivae and EOM are normal. Pupils are equal, round, and reactive to light. Right eye exhibits no discharge.  Neck: Normal range of motion and full passive range of motion without pain. Neck supple. No spinous process tenderness and no muscular tenderness present. No neck rigidity. No edema, no erythema and normal range of motion present. No thyromegaly present.  Cardiovascular: Normal rate, regular rhythm, normal heart sounds and intact distal pulses.  Exam reveals no gallop and no friction rub.   No murmur heard. Pulmonary/Chest: Effort normal and breath sounds normal. No respiratory distress. She has no wheezes.  Abdominal: Soft. Bowel sounds are normal. She exhibits no distension. There is no tenderness. There is no rebound and no guarding.  Musculoskeletal: Normal range of motion. She exhibits no edema.  Lymphadenopathy:    She has no cervical adenopathy.  Neurological: She is alert and oriented to person, place, and time.  The patient is alert, attentive, and oriented x 3. Speech is clear. Cranial nerve II-VII  grossly intact. Negative pronator drift. Sensation intact. Strength 5/5 in all extremities. Reflexes 2+ and symmetric at biceps, triceps, knees, and ankles. Rapid alternating movement and fine finger movements intact. Romberg is absent. Posture and gait normal.   Skin: Skin is warm and dry. Capillary refill takes less than 2 seconds.  Psychiatric: She has a normal mood and affect. Judgment normal.  Nursing note and vitals reviewed.    Visual Acuity  Right Eye Distance:   Left Eye Distance:   Bilateral Distance:  (Pt. has glasses on that are not her glasses due to she wears her husbands glasses.  Pt. is 20/70 bilat.  with glasses on.  Pt. no able to see at all with out the glasses.)  Right Eye Near:   Left Eye Near:    Bilateral Near:      ED Treatments / Results  DIAGNOSTIC STUDIES: Oxygen Saturation is 100% on RA, normal by my interpretation.    COORDINATION OF CARE: 4:45 PM- Pt advised  of plan for treatment and pt agrees. Pt will receive CT scan for further evaluation.    Labs (all labs ordered are listed, but only abnormal results are displayed) Labs Reviewed - No data to display  EKG  EKG Interpretation None       Radiology Ct Head Wo Contrast  Result Date: 05/15/2016 CLINICAL DATA:  Blurred vision in right eye followed by headache worsening on the left for the past 4 days. Denies head injury. EXAM: CT HEAD WITHOUT CONTRAST TECHNIQUE: Contiguous axial images were obtained from the base of the skull through the vertex without intravenous contrast. COMPARISON:  None. FINDINGS: BRAIN: The ventricles and sulci are normal. No intraparenchymal hemorrhage, mass effect nor midline shift. No acute large vascular territory infarcts. No abnormal extra-axial fluid collections. Basal cisterns are patent. VASCULAR: Unremarkable. SKULL/SOFT TISSUES: No skull fracture. No significant soft tissue swelling. ORBITS/SINUSES: The included ocular globes and orbital contents are normal.The  mastoid aircells and included paranasal sinuses are well-aerated. Mucous retention cyst noted posteriorly within the left maxillary sinus. OTHER: None. IMPRESSION: No acute intracranial abnormality. Left maxillary sinus mucous retention cyst. Electronically Signed   By: Tollie Eth M.D.   On: 05/15/2016 18:02    Procedures Procedures (including critical care time)  Medications Ordered in ED Medications  sodium chloride 0.9 % bolus 1,000 mL (0 mLs Intravenous Stopped 05/15/16 1927)  ketorolac (TORADOL) 30 MG/ML injection 30 mg (30 mg Intravenous Given 05/15/16 1924)     Initial Impression / Assessment and Plan / ED Course  Demetrios Loll, PA-C has reviewed the triage vital signs and the nursing notes.  Pertinent labs & imaging results that were available during my care of the patient were reviewed by me and considered in my medical decision making (see chart for details).  Clinical Course  Pt HA treated and improved while in ED.  Presentation  non concerning for [facility], ICH, Meningitis, or temporal arteritis. Pt is afebrile with no focal neuro deficits, nuchal rigidity, or change in vision. No evidence of glaucoma or preorbital cellulitis. 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 She will continue NSAIDs and musce relaxer at home. Patient ws seen and evaluated by DR. Mesner who agree with the above plan. Pt is to follow up with PCP to discuss prophylactic medication. Pt verbalizes understanding and is agreeable with plan to dc. She is hemodynamically stable. She will be discharged home in NAD with stable vs. Strict return precautions given.    Final Clinical Impressions(s) / ED Diagnoses   Final diagnoses:  Nonintractable headache, unspecified chronicity pattern, unspecified headache type    New Prescriptions Discharge Medication List as of 05/15/2016  8:42 PM     I personally performed the services described in this documentation, which was scribed in  my presence. The recorded information has been reviewed and is accurate.    Rise Mu, PA-C 05/16/16 1042    Marily Memos, MD 05/17/16 [phone]

## 2016-05-15 NOTE — ED Triage Notes (Signed)
Blurred vision in her right eye followed by a headache worse on the left side of her head x 4 days ago. She has been taking Tylenol with not much relief.

## 2016-05-15 NOTE — Discharge Instructions (Signed)
Please continue using her molixcam and muscle relaxer. You may also use heat to help with pain. Please return to the ED if you develop worsening blurry vision, worsening headache, slurred speech, one sided weakness or for any other reason. Please follow up with your primary care doctor.

## 2016-05-15 NOTE — ED Provider Notes (Signed)
Medical screening examination/treatment/procedure(s) were conducted as a shared visit with non-physician practitioner(s) and myself.  I personally evaluated the patient during the encounter.  25 year old female with blurry vision in one eye a few days ago. Since that time she's had intermittent left-sided headache. Seems to radiate from the upper part of her neck up to her scalp is burning in nature. Sometimes is worse with movement of her head.  On My examination she has normal neurologic exam to include cranial nerves, motor and sensation of all extremities and deep tendon reflexes and finger to nose. She has some tenderness in that left side of her neck but no e/o infection. She had equal vision in both eyes. No evidence of glaucoma or preorbital cellulitis. I suspect she has occipital neuralgia however her HA improved prior to my evaluation, so she will do NSAIDs at home. I discussed possibly doing an injection and she said that she would return if the pain got worse.   Marily MemosJason Wendie Diskin, MD 05/17/16 [phone]

## 2020-01-10 ENCOUNTER — Other Ambulatory Visit: Payer: Self-pay | Admitting: Nurse Practitioner

## 2020-01-10 DIAGNOSIS — N6315 Unspecified lump in the right breast, overlapping quadrants: Secondary | ICD-10-CM

## 2020-01-13 ENCOUNTER — Other Ambulatory Visit: Payer: Self-pay

## 2020-01-13 ENCOUNTER — Ambulatory Visit
Admission: RE | Admit: 2020-01-13 | Discharge: 2020-01-13 | Disposition: A | Discharge status: Home / Self Care | Payer: BC Managed Care – PPO | Source: Ambulatory Visit | Attending: Nurse Practitioner | Admitting: Nurse Practitioner

## 2020-01-13 DIAGNOSIS — N6315 Unspecified lump in the right breast, overlapping quadrants: Secondary | ICD-10-CM

## 2020-04-10 ENCOUNTER — Other Ambulatory Visit: Payer: Self-pay

## 2020-04-10 ENCOUNTER — Encounter: Payer: Self-pay | Admitting: Plastic Surgery

## 2020-04-10 ENCOUNTER — Ambulatory Visit (INDEPENDENT_AMBULATORY_CARE_PROVIDER_SITE_OTHER): Payer: BC Managed Care – PPO | Admitting: Plastic Surgery

## 2020-04-10 DIAGNOSIS — K4090 Unilateral inguinal hernia, without obstruction or gangrene, not specified as recurrent: Secondary | ICD-10-CM | POA: Diagnosis not present

## 2020-04-10 DIAGNOSIS — M793 Panniculitis, unspecified: Secondary | ICD-10-CM | POA: Diagnosis not present

## 2020-04-10 NOTE — Progress Notes (Signed)
Patient ID: Bianca Maxwell, female    DOB: 11/30/1990, 29 y.o.   MRN: [id number]   Chief Complaint  Patient presents with  . Advice Only    The patient is a 29 year old female here for evaluation of her abdomen. She is 5 feet 4 inches tall and weighs 172 pounds she has lost weight from 300 pounds of the last several years and has been able to keep it off for the last year. She complains of rashes and odor and her intertrigo in his areas. She has had to treat them with lotions and powder. She often has rashes and skin irritation as well. She has not had any surgery. She does not smoke and does not have diabetes. She has a right inguinal hernia that seems to be getting worse with time. She says sometimes it bulges more than others. Today it is not bulging. She does have some irritation noted in her skin folds. No abdominal hernia is noted. She has an autistic child at home and her husband does trucking. Patient discussed that she still wants to have the skin and muscle tightening above the umbilicus.   Review of Systems  Constitutional: Positive for activity change. Negative for appetite change.  HENT: Negative.   Eyes: Negative.   Respiratory: Negative.  Negative for chest tightness and shortness of breath.   Cardiovascular: Negative.  Negative for leg swelling.  Gastrointestinal: Negative for abdominal distention and abdominal pain.  Endocrine: Negative.   Genitourinary: Negative.   Musculoskeletal: Positive for back pain.  Skin: Positive for color change and rash.  Neurological: Negative.   Hematological: Negative.   Psychiatric/Behavioral: Negative.     Past Medical History:  Diagnosis Date  . Chronic kidney disease   . Normal pregnancy in multigravida in third trimester 02/15/2014  . SVD (spontaneous vaginal delivery) 02/16/2014    History reviewed. No pertinent surgical history.   No current outpatient medications on file.   Objective:   Vitals:   04/10/20 0932  BP:  113/77  Pulse: (!) 44  Temp: 98.7 F (37.1 C)  SpO2: 100%    Physical Exam Vitals and nursing note reviewed.  Constitutional:      Appearance: Normal appearance.  HENT:     Head: Normocephalic and atraumatic.  Cardiovascular:     Rate and Rhythm: Normal rate.     Pulses: Normal pulses.  Pulmonary:     Effort: Pulmonary effort is normal.  Abdominal:     General: Abdomen is flat. There is no distension.     Tenderness: There is no abdominal tenderness.  Skin:    General: Skin is warm.     Capillary Refill: Capillary refill takes less than 2 seconds.  Neurological:     General: No focal deficit present.     Mental Status: She is alert and oriented to person, place, and time.  Psychiatric:        Mood and Affect: Mood normal.        Behavior: Behavior normal.     Assessment & Plan:  Panniculitis  Non-recurrent unilateral inguinal hernia without obstruction or gangrene  The patient is a very good candidate for panniculectomy. She would benefit from an abdominoplasty added to it. I would like her to see Dr. Magnus Ivan for possible coordinated surgery for the hernia. We will provide her with a quote for the abdominal portion of the surgery.  Pictures were obtained of the patient and placed in the chart with the patient's or guardian's  permission.   Bianca Bills Jareli Highland, DO

## 2020-05-07 ENCOUNTER — Other Ambulatory Visit: Payer: Self-pay | Admitting: Surgery

## 2020-06-12 DIAGNOSIS — Z719 Counseling, unspecified: Secondary | ICD-10-CM

## 2020-06-26 ENCOUNTER — Other Ambulatory Visit: Payer: Self-pay | Admitting: Surgery

## 2020-07-29 NOTE — Progress Notes (Deleted)
ICD-10-CM   1. Panniculitis  M79.3   2. Non-recurrent unilateral inguinal hernia without obstruction or gangrene  K40.90       Patient ID: Bianca Maxwell, female    DOB: 1991/06/30, 30 y.o.   MRN: [id number]   History of Present Illness: Bianca Maxwell is a 30 y.o.  female  with a history of panniculitis.  She presents for preoperative evaluation for upcoming procedure, panniculectomy with abdominoplasty addition, scheduled for 08/24/2019 with Dr. Ulice Bold.  Summary from previous visit: Patient has excess tissue of her abdomen.  She is 5 feet 4 inches tall and weighs 172 pounds.  She was previously 300 pounds and has kept the weight off for the last year.  She complains of rashes, odor, and intertrigo under her pannus.  Reports she also has a right inguinal hernia that seems to be getting worse.  No abdominal hernia is noted.  She desires skin and muscle tightening above the umbilicus.  She has an autistic child at home and her husband does trucking.  Job: ***  PMH Significant for: CKD, latex allergy  The patient {HAS HAS [id number]} had problems with anesthesia. ***  Past Medical History: Allergies: Allergies  Allergen Reactions  . Latex Itching    Current Medications: No current outpatient medications on file.  Past Medical Problems: Past Medical History:  Diagnosis Date  . Chronic kidney disease   . Normal pregnancy in multigravida in third trimester 02/15/2014  . SVD (spontaneous vaginal delivery) 02/16/2014    Past Surgical History: No past surgical history on file.  Social History: Social History   Socioeconomic History  . Marital status: Married    Spouse name: Not on file  . Number of children: Not on file  . Years of education: Not on file  . Highest education level: Not on file  Occupational History  . Not on file  Tobacco Use  . Smoking status: Former Smoker    Packs/day: 0.50    Types: Cigarettes    Quit date: 11/26/2019    Years since quitting:  0.6  . Smokeless tobacco: Never Used  Substance and Sexual Activity  . Alcohol use: No  . Drug use: No  . Sexual activity: Never    Birth control/protection: None  Other Topics Concern  . Not on file  Social History Narrative  . Not on file   Social Determinants of Health   Financial Resource Strain: Not on file  Food Insecurity: Not on file  Transportation Needs: Not on file  Physical Activity: Not on file  Stress: Not on file  Social Connections: Not on file  Intimate Partner Violence: Not on file    Family History: No family history on file.  Review of Systems: ROS  Physical Exam: Vital Signs There were no vitals taken for this visit. Physical Exam  Assessment/Plan:  Ms. Daffern scheduled for panniculectomy with abdominoplasty addition with Dr. Ulice Bold.  Risks, benefits, and alternatives of procedure discussed, questions answered and consent obtained.    Smoking Status: ***; Counseling Given? ***  Caprini Score: ***; Risk Factors include: 30 year old female, BMI *** 25, and length of planned surgery. Recommendation for mechanical *** pharmacological prophylaxis during surgery. Encourage early ambulation.   Pictures obtained: 04/10/2020  Post-op Rx sent to pharmacy: ***  Patient was provided with the panniculectomy and General Surgical Risk consent document and Pain Medication Agreement prior to their appointment.  They had adequate time to read through the risk consent documents and Pain Medication  Agreement. We also discussed them in person together during this preop appointment. All of their questions were answered to their satisfaction.  Recommended calling if they have any further questions.  Risk consent form and Pain Medication Agreement to be scanned into patient's chart.  The risk that can be encountered for this procedure were discussed and include the following but not limited to these: asymmetry, fluid accumulation, firmness of the tissue, skin loss,  decrease or no sensation, fat necrosis, bleeding, infection, healing delay.  Deep vein thrombosis, cardiac and pulmonary complications are risks to any procedure.  There are risks of anesthesia, changes to skin sensation and injury to nerves or blood vessels.  The muscle can be temporarily or permanently injured.  You may have an allergic reaction to tape, suture, glue, blood products which can result in skin discoloration, swelling, pain, skin lesions, poor healing.  Any of these can lead to the need for revisonal surgery or stage procedures.  Weight gain and weigh loss can also effect the long term appearance. The results are not guaranteed to last a lifetime.  Future surgery may be required.    Electronically signed by: Eldridge Abrahams, PA-C 07/29/2020 4:01 PM

## 2020-07-31 ENCOUNTER — Encounter: Payer: BC Managed Care – PPO | Admitting: Plastic Surgery

## 2020-07-31 DIAGNOSIS — M793 Panniculitis, unspecified: Secondary | ICD-10-CM

## 2020-07-31 DIAGNOSIS — K4090 Unilateral inguinal hernia, without obstruction or gangrene, not specified as recurrent: Secondary | ICD-10-CM

## 2020-08-01 ENCOUNTER — Encounter: Payer: Self-pay | Admitting: Plastic Surgery

## 2020-08-01 ENCOUNTER — Encounter: Payer: Self-pay | Admitting: Surgical

## 2020-08-01 ENCOUNTER — Other Ambulatory Visit: Payer: Self-pay

## 2020-08-01 ENCOUNTER — Ambulatory Visit (INDEPENDENT_AMBULATORY_CARE_PROVIDER_SITE_OTHER): Payer: Self-pay | Admitting: Plastic Surgery

## 2020-08-01 VITALS — BP 136/86 | HR 63 | Temp 97.8°F

## 2020-08-01 DIAGNOSIS — K4090 Unilateral inguinal hernia, without obstruction or gangrene, not specified as recurrent: Secondary | ICD-10-CM

## 2020-08-01 DIAGNOSIS — M793 Panniculitis, unspecified: Secondary | ICD-10-CM

## 2020-08-01 MED ORDER — HYDROCODONE-ACETAMINOPHEN 5-325 MG PO TABS: 1.0000 | ORAL_TABLET | Freq: Three times a day (TID) | ORAL | 0 refills | Status: AC | PRN

## 2020-08-01 MED ORDER — CEPHALEXIN 500 MG PO CAPS: 500.0000 mg | ORAL_CAPSULE | Freq: Four times a day (QID) | ORAL | 0 refills | Status: AC

## 2020-08-01 MED ORDER — ONDANSETRON 4 MG PO TBDP: 4.0000 mg | ORAL_TABLET | Freq: Three times a day (TID) | ORAL | 0 refills | Status: DC | PRN

## 2020-08-01 NOTE — Progress Notes (Signed)
ICD-10-CM   1. Panniculitis  M79.3   2. Non-recurrent unilateral inguinal hernia without obstruction or gangrene  K40.90       Patient ID: Bianca Maxwell, female    DOB: 10/31/1990, 29 y.o.   MRN: [id number]   History of Present Illness: Bianca Maxwell is a 30 y.o.  female  with a history of Panniculitis.  She presents for preoperative evaluation for upcoming procedure, panniculectomy with abdominoplasty addition, scheduled for 08/24/2019 with Dr. Ulice Bold in combination with her inguinal hernia repair.  Summary from previous visit: Patient is 5 feet 4 inches tall weighs 172 pounds.  She previously weighed 300 pounds, lost the weight, and has been able to keep it off for the last year.  She has bothered by the excess skin as well as dealing with rashes, odor, and intertrigo in this area.  She also is interested in having the skin and muscle tightening above the umbilicus she has a right inguinal hernia that seems to be getting worse with time.  She is looking to have both these addressed in the same surgery.  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  Her husband does trucking.  PMH Significant for: Latex allergy  The patient has not had problems with anesthesia.  No previous surgeries.  Past Medical History: Allergies: Allergies  Allergen Reactions  . Latex Itching    Current Medications: No current outpatient medications on file.  Past Medical Problems: Past Medical History:  Diagnosis Date  . Chronic kidney disease   . Normal pregnancy in multigravida in third trimester 02/15/2014  . SVD (spontaneous vaginal delivery) 02/16/2014    Past Surgical History: History reviewed. No pertinent surgical history.  Social History: Social History   Socioeconomic History  . Marital status: Married    Spouse name: Not on file  . Number of children: Not on file  . Years of education: Not on file  . Highest education level: Not on file  Occupational  History  . Not on file  Tobacco Use  . Smoking status: Former Smoker    Packs/day: 0.50    Types: Cigarettes    Quit date: 11/26/2019    Years since quitting: 0.6  . Smokeless tobacco: Never Used  Substance and Sexual Activity  . Alcohol use: No  . Drug use: No  . Sexual activity: Never    Birth control/protection: None  Other Topics Concern  . Not on file  Social History Narrative  . Not on file   Social Determinants of Health   Financial Resource Strain: Not on file  Food Insecurity: Not on file  Transportation Needs: Not on file  Physical Activity: Not on file  Stress: Not on file  Social Connections: Not on file  Intimate Partner Violence: Not on file    Family History: History reviewed. No pertinent family history.  Review of Systems: Review of Systems  Constitutional: Negative for chills and fever.  HENT: Negative for congestion and sore throat.   Respiratory: Negative for cough and shortness of breath.   Cardiovascular: Negative for chest pain.  Gastrointestinal: Negative for abdominal pain, nausea and vomiting.  Skin: Negative for itching and rash (Not currently, but gets these frequently under her pannus).    Physical Exam: Vital Signs BP 136/86 (BP Location: Left Arm, Patient Position: Sitting, Cuff Size: Normal)   Pulse 63   Temp 97.8 F (36.6 C) (Oral)   SpO2 100%  Physical Exam Vitals and nursing note reviewed.  Constitutional:      General: She is not in acute distress.    Appearance: Normal appearance. She is not ill-appearing.  HENT:     Head: Normocephalic and atraumatic.  Eyes:     Extraocular Movements: Extraocular movements intact.  Cardiovascular:     Rate and Rhythm: Normal rate and regular rhythm.     Pulses: Normal pulses.     Heart sounds: Normal heart sounds.  Pulmonary:     Effort: Pulmonary effort is normal.     Breath sounds: Normal breath sounds. No wheezing, rhonchi or rales.  Abdominal:     General: Bowel sounds are  normal.     Palpations: Abdomen is soft.  Musculoskeletal:        General: No swelling. Normal range of motion.     Cervical back: Normal range of motion.  Skin:    General: Skin is warm and dry.     Coloration: Skin is not pale.     Findings: No erythema or rash.  Neurological:     General: No focal deficit present.     Mental Status: She is alert and oriented to person, place, and time.  Psychiatric:        Mood and Affect: Mood normal.        Behavior: Behavior normal.        Thought Content: Thought content normal.        Judgment: Judgment normal.     Assessment/Plan:  Bianca Maxwell scheduled for panniculectomy with abdominoplasty addition with Dr. Ulice Bold in combination with inguinal hernia repair with Dr. Magnus Ivan.  Risks, benefits, and alternatives of procedure discussed, questions answered and consent obtained.    Smoking Status: Quit 7 months ago; Counseling Given?  Yes  Caprini Score: Moderate; Risk Factors include: 30 year old female, BMI > 25, and length of planned surgery. Recommendation for mechanical or pharmacological prophylaxis during surgery. Encourage early ambulation.   Pictures obtained: 04/10/2020  Post-op Rx sent to pharmacy: Norco, Zofran, Keflex  Patient was provided with the General Surgical Risk consent document and Pain Medication Agreement prior to their appointment.  They had adequate time to read through the risk consent documents and Pain Medication Agreement. We also discussed them in person together during this preop appointment. All of their questions were answered to their satisfaction.  Recommended calling if they have any further questions.  Risk consent form and Pain Medication Agreement to be scanned into patient's chart.  The risk that can be encountered for this procedure were discussed and include the following but not limited to these: asymmetry, fluid accumulation, firmness of the tissue, skin loss, decrease or no sensation, fat necrosis,  bleeding, infection, healing delay.  Deep vein thrombosis, cardiac and pulmonary complications are risks to any procedure.  There are risks of anesthesia, changes to skin sensation and injury to nerves or blood vessels.  The muscle can be temporarily or permanently injured.  You may have an allergic reaction to tape, suture, glue, blood products which can result in skin discoloration, swelling, pain, skin lesions, poor healing.  Any of these can lead to the need for revisonal surgery or stage procedures.  Weight gain and weigh loss can also effect the long term appearance. The results are not guaranteed to last a lifetime.  Future surgery may be required.    Electronically signed by: Eldridge Abrahams, PA-C 08/01/2020 10:07 AM

## 2020-08-10 ENCOUNTER — Encounter: Payer: BC Managed Care – PPO | Admitting: Plastic Surgery

## 2020-08-17 ENCOUNTER — Encounter: Payer: BC Managed Care – PPO | Admitting: Plastic Surgery

## 2020-08-20 DIAGNOSIS — M793 Panniculitis, unspecified: Secondary | ICD-10-CM | POA: Diagnosis not present

## 2020-08-24 ENCOUNTER — Encounter: Payer: BC Managed Care – PPO | Admitting: Plastic Surgery

## 2020-08-24 ENCOUNTER — Telehealth: Payer: Self-pay

## 2020-08-24 ENCOUNTER — Telehealth: Payer: Self-pay | Admitting: Plastic Surgery

## 2020-08-24 MED ORDER — KETOROLAC TROMETHAMINE 10 MG PO TABS: 10.0000 mg | ORAL_TABLET | Freq: Three times a day (TID) | ORAL | 0 refills | Status: DC | PRN

## 2020-08-24 NOTE — Telephone Encounter (Signed)
Patient said her prescriptions were called into the wrong pharmacy. [facility] to [facility] [address] in [place]. Please call to advise

## 2020-08-24 NOTE — Telephone Encounter (Signed)
Patient's husband called to say his wife had a tummy tuck surgery with Dr. Ulice Bold yesterday and he would like to speak with someone about her pain management schedule because she is in severe pain.  Please call.

## 2020-08-24 NOTE — Telephone Encounter (Signed)
Called [facility] on [address] to cancel prescription and [facility] [address]. Spoke with the pharmacy and the husband was there at that time. The pharmacy was currently working on patients insurance for the medication. Advised them not to cancel prescription due to the husband was at the store.  Called husband, LMVM.  Under the advice from Dr. Ulice Bold- patient to take the Toradol every 8 hours as needed for 5 days. She can alternate ibuprofen and tylenol during the day and take the hydrocodone before she goes to bed. Drink plenty of fluids to stay hydrated. Reminded of PO visit on 08/31/20 at 9:30 with Dr. Ulice Bold.

## 2020-08-27 ENCOUNTER — Telehealth: Payer: Self-pay

## 2020-08-27 MED ORDER — KETOROLAC TROMETHAMINE 10 MG PO TABS: 10.0000 mg | ORAL_TABLET | Freq: Three times a day (TID) | ORAL | 0 refills | Status: AC

## 2020-08-27 NOTE — Telephone Encounter (Signed)
Rx sent to pharmacy for 5 more days. She is further out from surgery so she should begin increasing time between doses.  Please call patient.

## 2020-08-27 NOTE — Telephone Encounter (Signed)
Patient called to say that she had surgery with Dr. Ulice Bold last Thursday and she just took her last anti inflammatory pill (10mg  of Ketorolac) today.  She would like for to refill her prescription.  [facility] [address]  Please call.

## 2020-08-27 NOTE — Telephone Encounter (Signed)
Call returned to pt per [place], [place] regarding the following: Refill for Ketorolac I informed the pt that [facility] sent the refill to her pharmacy of choice- but wanted to recommend that pt begin to increase the time between doses. Per pt - she is c/o pain 6 - 8/10 - & she is experiencing " burning/tight" sensation with walking.  She has used OTC- tylenol & Ibuprofen between doses without significant relief. She denies any chills/fever & no sign of complication from the incisions. She is drinking/eating- but has had nausea which is relieved with nausea medication. No vomiting She has normal bladder function- but has not had a bowel movement since the day before surgery. She is using OTC stool softener & trying to increase fiber in her diet. I recommended she try OTC fiber/Miralax supplement & drink water. She understands the above instructions & will call for any further concerns- otherwise she has f/u visit with Dr. Ulice Bold on 08/31/20

## 2020-08-30 ENCOUNTER — Telehealth: Payer: Self-pay

## 2020-08-30 ENCOUNTER — Telehealth: Payer: Self-pay | Admitting: Plastic Surgery

## 2020-08-30 NOTE — Telephone Encounter (Signed)
Patient's husband, Fayrene Fearing, said that patient has fluid coming out from her hip around the location of the tube on her right side. It said it's leaking. Please call patient to advise what she should do until her appt tomorrow. [phone]

## 2020-08-30 NOTE — Telephone Encounter (Signed)
Call returned to pt regarding the following: She & her husband noticed that she is now having  drainage from the drain tube site of insertion at abdominal incision Drainage is measured 20 to 30 ml per 24 hrs. She states the fluid is now "pink/tinged" The drainage from the site has saturated her dressings 1x/today. She attempted to remove " tissue clot" this afternoon  From the tubing & she continues to monitor for decreased leakage.  She has not had to change the dressing again at this time. She denies any chills/fever & no indication of redness.  She states the incision appears to be intact. She is eating/drinking. She describes the pain as " throbbing"- 6/10 with movement. She is getting relief with pain meds. & RX for inflammation. She denies any increased swelling or asymmetry. I informed her that the fluid that is produced by the body after a procedure is better "out / than in"   She will reinforce the dressings as needed for drainage. She will continue to monitor for any other complications. She has a f/u tomorrow 08/31/20  with Dr. Ulice Bold.

## 2020-08-31 ENCOUNTER — Other Ambulatory Visit: Payer: Self-pay

## 2020-08-31 ENCOUNTER — Ambulatory Visit (INDEPENDENT_AMBULATORY_CARE_PROVIDER_SITE_OTHER): Payer: BC Managed Care – PPO | Admitting: Plastic Surgery

## 2020-08-31 ENCOUNTER — Encounter: Payer: Self-pay | Admitting: Plastic Surgery

## 2020-08-31 VITALS — BP 117/65 | HR 85

## 2020-08-31 DIAGNOSIS — M793 Panniculitis, unspecified: Secondary | ICD-10-CM

## 2020-08-31 MED ORDER — HYDROCODONE-ACETAMINOPHEN 5-325 MG PO TABS: 1.0000 | ORAL_TABLET | Freq: Two times a day (BID) | ORAL | 0 refills | Status: AC | PRN

## 2020-08-31 NOTE — Progress Notes (Signed)
Is a 30 year old female here for follow-up on her abdominal surgery.  Her drain output is minimal.  She has swelling and bruising as expected.  I was able to remove the right drain.  I am going to keep the dressings in place until her next visit.  She can shower and I recommend her going into a spanks.  Her bowels are moving.  Her pain is controlled.  She asked for refill on the pain meds which I will send to pharmacy.

## 2020-09-03 DIAGNOSIS — Z9889 Other specified postprocedural states: Secondary | ICD-10-CM | POA: Insufficient documentation

## 2020-09-03 NOTE — Progress Notes (Signed)
Patient is a 30 year old female here for follow-up after undergoing a panniculectomy with abdominoplasty addition on 08/24/2019 with Dr. Ulice Bold in combination with her inguinal hernia repair.  At last visit on 2/4 patient was given additional refill of her pain medication.   ~ 2 weeks PO Patient presents today with her husband.  They report overall she is doing well.  She is very anxious.  Denies fever/chills, nausea/vomiting.  Removed Aquacel dressings.  Steri-Strips in place.  Abdominal incision is intact, clean and dry.  Umbilical incision is intact clean and dry.  No signs of infection, redness, drainage.  Drain output has been less than 40 cc for the last few days.  Drain removed today.  Apply Vaseline and gauze to drain site for the next few days until closed.  Continue to wear compression garment 24/7 until 6 weeks postop.  Continue to avoid heavy lifting and vigorous activity.  Follow-up in 2-3 weeks.  Return precautions provided.  Call office with any questions/concerns.  Pictures were obtained of the patient and placed in the chart with the patient's or guardian's permission.

## 2020-09-06 ENCOUNTER — Encounter: Payer: Self-pay | Admitting: Plastic Surgery

## 2020-09-06 ENCOUNTER — Other Ambulatory Visit: Payer: Self-pay

## 2020-09-06 ENCOUNTER — Ambulatory Visit (INDEPENDENT_AMBULATORY_CARE_PROVIDER_SITE_OTHER): Payer: BC Managed Care – PPO | Admitting: Plastic Surgery

## 2020-09-06 VITALS — BP 138/89 | HR 65 | Temp 97.7°F

## 2020-09-06 DIAGNOSIS — Z9889 Other specified postprocedural states: Secondary | ICD-10-CM

## 2020-09-07 ENCOUNTER — Encounter: Payer: BC Managed Care – PPO | Admitting: Plastic Surgery

## 2020-09-10 ENCOUNTER — Encounter: Payer: Self-pay | Admitting: Plastic Surgery

## 2020-09-11 NOTE — Telephone Encounter (Signed)
Patient sent my chart messages on the evening of 09/10/2020, she subsequently called the on-call provider line reporting that she was concerned for infection of her bellybutton.  She reports that she has noticed a foul odor in this area. She reports that she has noticed an improvement in her incision after using this.  She also reports she called the office as she is worried about infection of her bellybutton as she has noticed some redness surrounding some of the incision.   I reviewed the EMR photos, based on the photos I discussed with the patient that this likely is a reaction to the sutures.  I do not see any sign of cellulitic changes or overt infection.  The redness surrounding her bellybutton appears more irritated/reactive.  I discussed with the patient to call with any questions or concerns, but at this time I do not feel as if she needs an antibiotic.

## 2020-09-13 ENCOUNTER — Encounter: Payer: Self-pay | Admitting: Plastic Surgery

## 2020-09-14 ENCOUNTER — Encounter: Payer: BC Managed Care – PPO | Admitting: Plastic Surgery

## 2020-09-17 ENCOUNTER — Encounter: Payer: Self-pay | Admitting: Plastic Surgery

## 2020-09-18 ENCOUNTER — Encounter: Payer: Self-pay | Admitting: Plastic Surgery

## 2020-09-18 ENCOUNTER — Other Ambulatory Visit: Payer: Self-pay

## 2020-09-18 ENCOUNTER — Ambulatory Visit (INDEPENDENT_AMBULATORY_CARE_PROVIDER_SITE_OTHER): Payer: BC Managed Care – PPO | Admitting: Plastic Surgery

## 2020-09-18 VITALS — BP 111/76 | HR 58

## 2020-09-18 DIAGNOSIS — Z9889 Other specified postprocedural states: Secondary | ICD-10-CM

## 2020-09-18 NOTE — Progress Notes (Signed)
Patient is a 30 year old female here for follow-up after undergoing a panniculectomy with abdominoplasty addition in 08/24/2019 with Dr. Ulice Bold in combination with her inguinal hernia repair.  ~ 4 weeks PO Presents today with her mother.  She is very anxious and concerned about her final results.  She is concerned about the excess loose skin in the mons area and some of the excess tissue above her incision.  She denies fever/chills, nausea/vomiting.  Abdominal incision is healing very nicely, C/D/I.  No signs of infection, redness or drainage.  Umbilical incision is healing well, C/D/I.  No signs of infection, redness, drainage.  Suture knots were removed from both incisions.  She has some swelling and abdominal fullness superior to her incision.  She has some loose skin in the mons area.  Recommend she give it a little more time to allow her body to heal to determine what her final results will be.  Reminded her that everybody's results are individual and based on your skin integrity as well as we are starting point.  Patient has been comparing herself to other folks with her gym.  Patient also had the recent death of her grandfather 2 weeks ago.  Patient agreed to give it some more time to allow her body to heal.  We set up appointment for 6 to 8 weeks for follow-up.  She may utilize scar cream of her choice at this time.  She should wait 2 more weeks and then may gradually begin resuming normal activities.  She should continue to wear compression garment 24/7 for 2 more weeks and then can wear it during the day only if preferred.  Patient is in agreement with plan.  Return precautions provided.  Call office with any questions/concerns.  Pictures were obtained of the patient and placed in the chart with the patient's or guardian's permission.

## 2020-09-21 ENCOUNTER — Encounter: Payer: BC Managed Care – PPO | Admitting: Plastic Surgery

## 2020-10-02 IMAGING — US US BREAST*R* LIMITED INC AXILLA
1 series · 5 of 5 positions shown · non-contrast
Comparison: Previous exam(s).
COMPARISON: Previous exam(s).

Addendum:
CLINICAL DATA: 28-year-old patient presents for evaluation of a
known mass in the [DATE] position of the right breast. We have
evaluated this in the past, in 8817, and findings were consistent
with a lactating adenoma that decreased in size over time.

The patient has lost approximately 130 pounds intentionally and
feels the mass more prominently now. She also says that there is a
burning sensation associated with the mass intermittently,
particularly around her menstrual cycle.
EXAM:
ULTRASOUND OF THE RIGHT BREAST

[Series 1: us breast*right* limited inc axilla · 0.06mm/px · 5 of 5 slices shown]
[im 1/5]
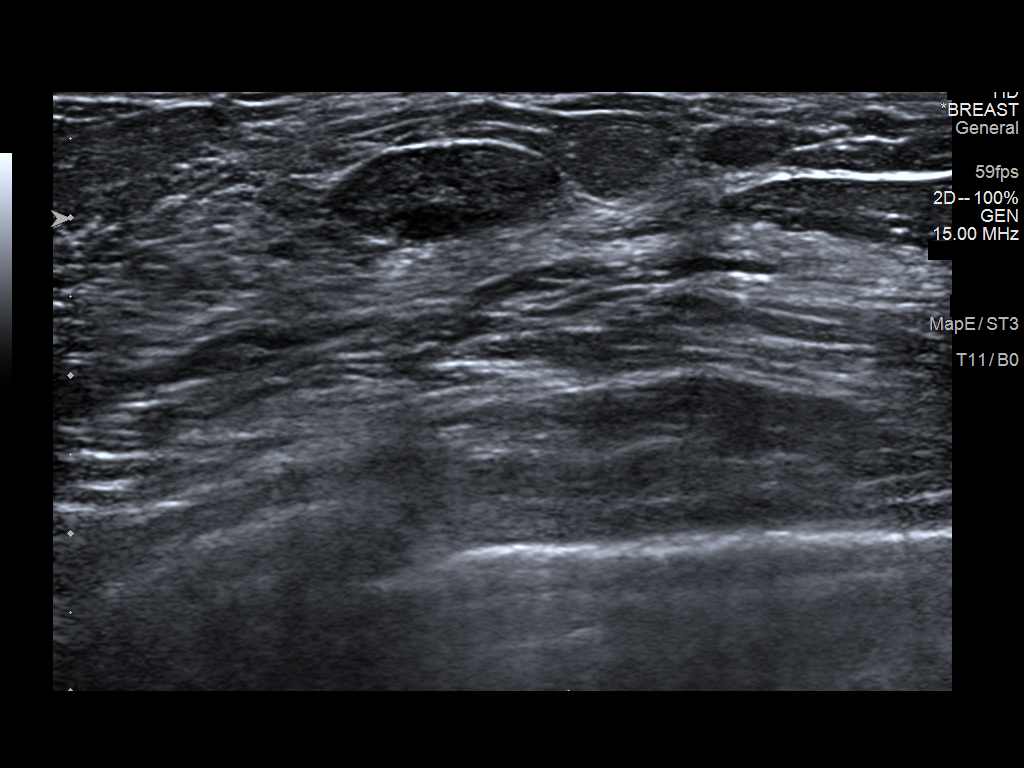
[im 2/5]
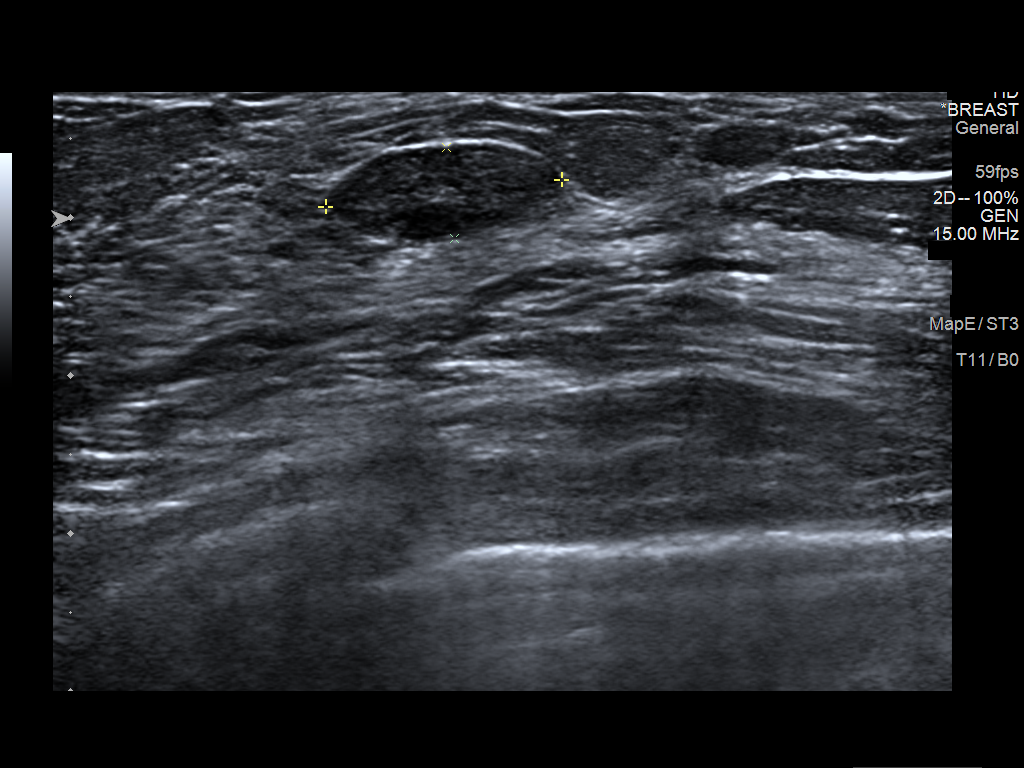
[im 3/5]
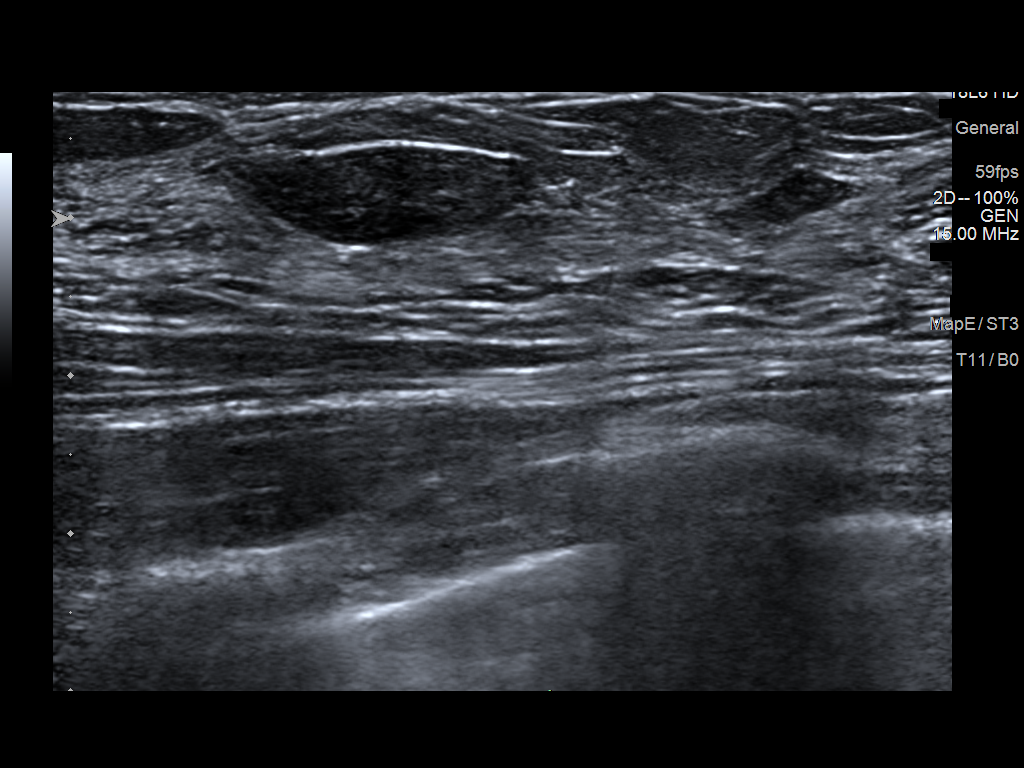
[im 4/5]
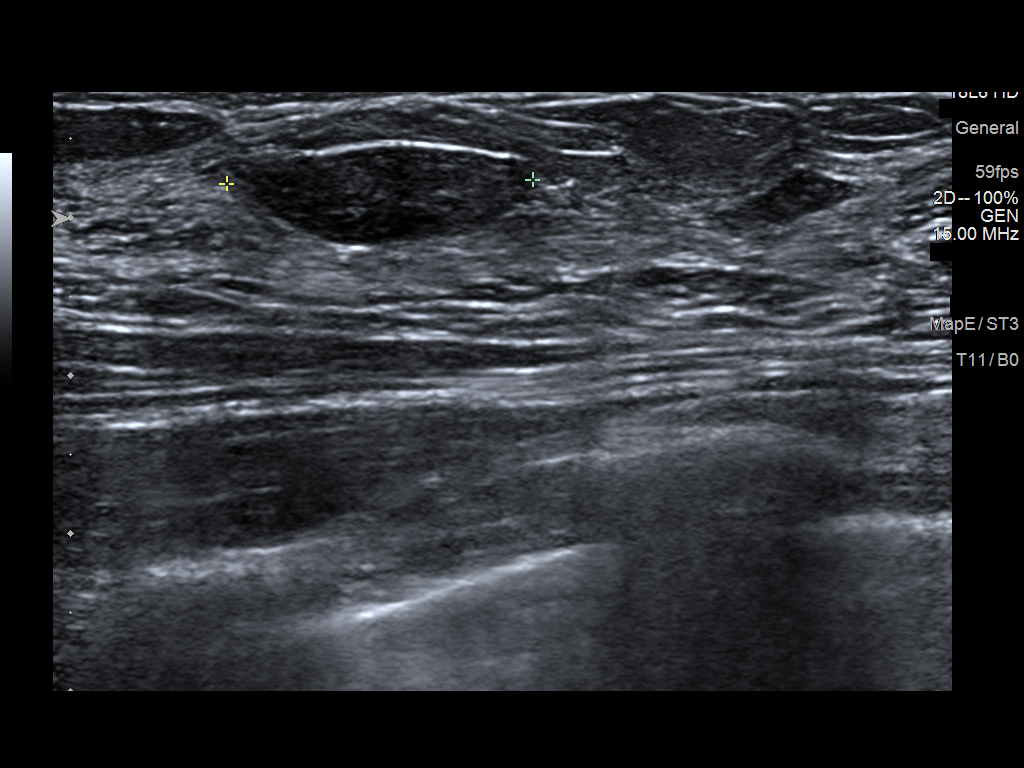
[im 5/5]
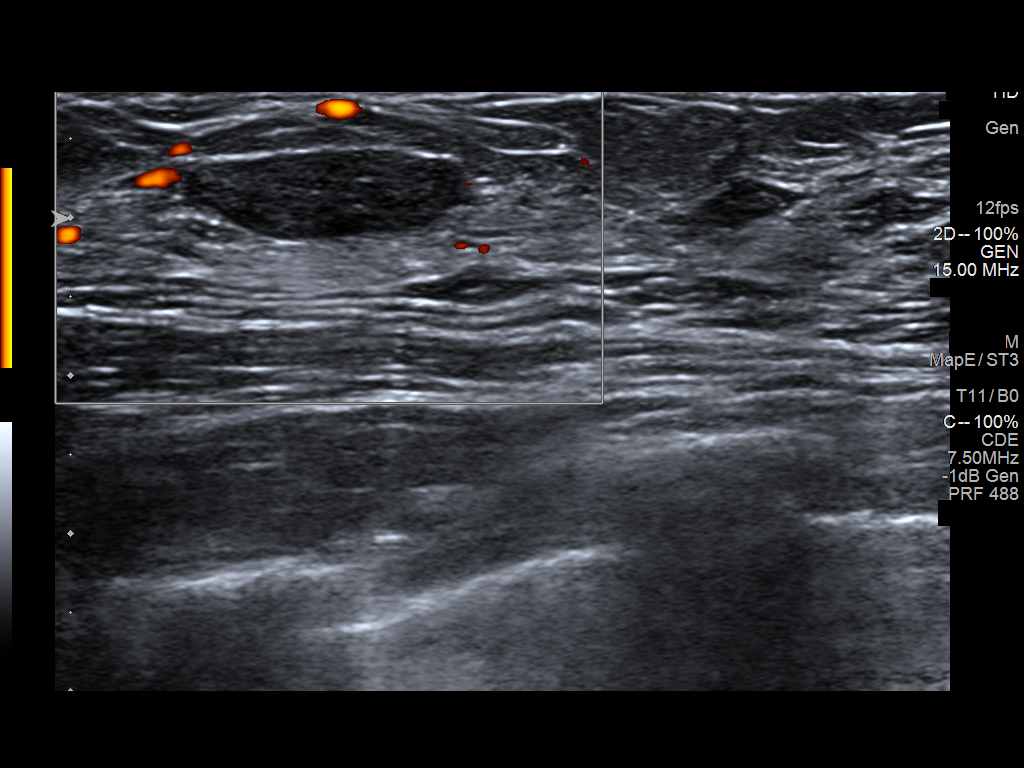

[5 of 5 positions shown; findings below may reference images not displayed]

FINDINGS: On physical exam, there is a smooth superficial approximately 1 cm
mass at [DATE] position from the nipple.

Targeted ultrasound is performed, showing circumscribed oval
homogeneously hypoechoic parallel mass [DATE] position 6 cm from
nipple measuring 1.9 x 0.6 x 1.5 cm. No internal vascular flow.
Overlying skin is normal.

Previously in October 2015 this mass measured 3.4 x 1.2 x 2.0 cm.
IMPRESSION: Further decrease in the size of a benign appearing mass in the right
breast [DATE] position 6 cm from the nipple since the last exam from
8817. Findings are consistent with a benign process, likely a
lactating adenoma or fibroadenoma that has decreased after pregnancy
and lactation.

RECOMMENDATION:
The patient reports that she desires surgical consultation given the
continued burning sensation associated with the mass. She is aware
that the mass has no suspicious features on imaging.

Surgical consultation will be arranged for the patient to discuss
the possibility of excision.

Annual mammography is recommended at age 40

I have discussed the findings and recommendations with the patient.
If applicable, a reminder letter will be sent to the patient
regarding the next appointment.

BI-RADS CATEGORY  2: Benign.

ADDENDUM:
Surgical consultation has been arranged with Dr. Letiomalo Resto at
[REDACTED] on February 07, 2020.

Salomon Sleeper RN on 01/13/2020.

*** End of Addendum ***
FINDINGS: On physical exam, there is a smooth superficial approximately 1 cm
mass at [DATE] position from the nipple.

Targeted ultrasound is performed, showing circumscribed oval
homogeneously hypoechoic parallel mass [DATE] position 6 cm from
nipple measuring 1.9 x 0.6 x 1.5 cm. No internal vascular flow.
Overlying skin is normal.

Previously in October 2015 this mass measured 3.4 x 1.2 x 2.0 cm.
IMPRESSION: Further decrease in the size of a benign appearing mass in the right
breast [DATE] position 6 cm from the nipple since the last exam from
8817. Findings are consistent with a benign process, likely a
lactating adenoma or fibroadenoma that has decreased after pregnancy
and lactation.

RECOMMENDATION:
The patient reports that she desires surgical consultation given the
continued burning sensation associated with the mass. She is aware
that the mass has no suspicious features on imaging.

Surgical consultation will be arranged for the patient to discuss
the possibility of excision.

Annual mammography is recommended at age 39

I have discussed the findings and recommendations with the patient.
If applicable, a reminder letter will be sent to the patient
regarding the next appointment.

BI-RADS CATEGORY  2: Benign.

## 2020-10-30 ENCOUNTER — Ambulatory Visit (INDEPENDENT_AMBULATORY_CARE_PROVIDER_SITE_OTHER): Payer: BC Managed Care – PPO | Admitting: Plastic Surgery

## 2020-10-30 ENCOUNTER — Other Ambulatory Visit: Payer: Self-pay

## 2020-10-30 ENCOUNTER — Encounter: Payer: Self-pay | Admitting: Plastic Surgery

## 2020-10-30 VITALS — BP 108/71 | HR 59

## 2020-10-30 DIAGNOSIS — Z9889 Other specified postprocedural states: Secondary | ICD-10-CM

## 2020-10-30 NOTE — Progress Notes (Signed)
   Subjective:    Patient ID: Bianca Maxwell, female    DOB: 06-29-1991, 30 y.o.   MRN: [id number]  The patient is a 30 year old female here with her husband for follow-up on her abdominal surgery.  The incision is healing very nicely.  There is no sign of infection.  There is no sign of a hematoma or a seroma.  The patient does not like the excess skin of her pubic area.  She also does not like a little bit extra bulge in the lower abdominal area.  It has gotten better with time.  I think that some of it may require revision.     Review of Systems  Constitutional: Negative.   HENT: Negative.   Eyes: Negative.   Respiratory: Negative.   Cardiovascular: Negative.   Gastrointestinal: Negative.   Genitourinary: Negative.        Objective:   Physical Exam Vitals and nursing note reviewed.  Constitutional:      Appearance: Normal appearance.  HENT:     Head: Normocephalic and atraumatic.  Cardiovascular:     Rate and Rhythm: Normal rate.     Pulses: Normal pulses.  Neurological:     Mental Status: She is alert. Mental status is at baseline.  Psychiatric:        Mood and Affect: Mood normal.        Assessment & Plan:     ICD-10-CM   1. S/P abdominoplasty  Z98.890     Recommend giving it a little more time to be sure the swelling has resolved.  Continue with compression garment.  Also recommend decreasing carbohydrates and sugars.  Massage any firm areas that may be stitches that have not absorbed yet.  Patient is in agreement we will see her back in the clinic.  Pictures were obtained of the patient and placed in the chart with the patient's or guardian's permission.

## 2021-02-01 ENCOUNTER — Ambulatory Visit: Payer: BC Managed Care – PPO | Admitting: Plastic Surgery

## 2021-03-29 ENCOUNTER — Ambulatory Visit: Payer: BC Managed Care – PPO | Admitting: Plastic Surgery

## 2021-08-31 ENCOUNTER — Other Ambulatory Visit: Payer: Self-pay

## 2021-08-31 ENCOUNTER — Ambulatory Visit
Admission: EM | Admit: 2021-08-31 | Discharge: 2021-08-31 | Disposition: A | Discharge status: Home / Self Care | Payer: BC Managed Care – PPO | Attending: Physician Assistant | Admitting: Physician Assistant

## 2021-08-31 ENCOUNTER — Encounter: Payer: Self-pay | Admitting: Emergency Medicine

## 2021-08-31 DIAGNOSIS — S0501XA Injury of conjunctiva and corneal abrasion without foreign body, right eye, initial encounter: Secondary | ICD-10-CM

## 2021-08-31 MED ORDER — POLYMYXIN B-TRIMETHOPRIM 10000-0.1 UNIT/ML-% OP SOLN: 1.0000 [drp] | OPHTHALMIC | 0 refills | Status: AC

## 2021-08-31 NOTE — ED Triage Notes (Signed)
Pt here for right eye irritation and watering starting last night; pt unsure if could be pink eye; denies any known injury

## 2021-08-31 NOTE — ED Provider Notes (Signed)
[facility]    CSN: [id number] Arrival date & time: 08/31/21  1459      History   Chief Complaint Chief Complaint  Patient presents with   Eye Pain    HPI Bianca Maxwell is a 31 y.o. female.   Patient here today for evaluation of right eye irritation that started after she was cleaning gym equipment and accidentally used her gloved hand to rub her right eye. She states this occurred on Wednesday and then on Thursday morning she awoke with eye discomfort, clear tearing and photosensitivity. Symptoms have persisted over the last 24 hours.   The history is provided by the patient.   Past Medical History:  Diagnosis Date   Chronic kidney disease    Normal pregnancy in multigravida in third trimester 02/15/2014   SVD (spontaneous vaginal delivery) 02/16/2014    Patient Active Problem List   Diagnosis Date Noted   S/P abdominoplasty 09/03/2020   Panniculitis 04/10/2020   Inguinal hernia 04/10/2020   Supervision of normal pregnancy in third trimester 02/16/2014   SVD (spontaneous vaginal delivery) 02/16/2014   Normal pregnancy in multigravida in third trimester 02/15/2014    History reviewed. No pertinent surgical history.  OB History     Gravida  4   Para  3   Term  3   Preterm      AB      Living  3      SAB      IAB      Ectopic      Multiple      Live Births  3            Home Medications    Prior to Admission medications   Medication Sig Start Date End Date Taking? Authorizing Provider  trimethoprim-polymyxin b (POLYTRIM) ophthalmic solution Place 1 drop into the right eye every 4 (four) hours for 7 days. 08/31/21 09/07/21 Yes Francene Finders, PA-C  acetaminophen (TYLENOL) 500 MG tablet Take 500 mg by mouth every 6 (six) hours as needed.    [provider]    Family History History reviewed. No pertinent family history.  Social History Social History   Tobacco Use   Smoking status: Former    Packs/day: 0.50     Types: Cigarettes    Quit date: 11/26/2019    Years since quitting: 1.7   Smokeless tobacco: Never  Substance Use Topics   Alcohol use: No   Drug use: No     Allergies   Latex   Review of Systems Review of Systems  Constitutional:  Negative for chills and fever.  HENT:  Positive for rhinorrhea. Negative for congestion.   Eyes:  Positive for photophobia, pain, discharge and redness.  Respiratory:  Negative for cough and shortness of breath.   Gastrointestinal:  Negative for abdominal pain, nausea and vomiting.    Physical Exam Triage Vital Signs ED Triage Vitals  Enc Vitals Group     BP      Pulse      Resp      Temp      Temp src      SpO2      Weight      Height      Head Circumference      Peak Flow      Pain Score      Pain Loc      Pain Edu?      Excl. in [place]?  No data found.  Updated Vital Signs BP 127/84 (BP Location: Left Arm)    Pulse 61    Temp 98 F (36.7 C) (Oral)    Resp 18    SpO2 99%      Physical Exam Vitals and nursing note reviewed.  Constitutional:      General: She is not in acute distress.    Appearance: Normal appearance. She is not ill-appearing.  HENT:     Head: Normocephalic and atraumatic.  Eyes:     General: Lids are normal.     Extraocular Movements: Extraocular movements intact.     Conjunctiva/sclera:     Right eye: Right conjunctiva is injected.     Left eye: Left conjunctiva is not injected.     Comments: Few pinpoint corneal abrasions noted to medial aspect of right corneal with fluorescein exam.  Cardiovascular:     Rate and Rhythm: Normal rate.  Pulmonary:     Effort: Pulmonary effort is normal.  Neurological:     Mental Status: She is alert.  Psychiatric:        Mood and Affect: Mood normal.        Behavior: Behavior normal.        Thought Content: Thought content normal.     UC Treatments / Results  Labs (all labs ordered are listed, but only abnormal results are displayed) Labs Reviewed - No data to  display  EKG   Radiology No results found.  Procedures Procedures (including critical care time)  Medications Ordered in UC Medications - No data to display  Initial Impression / Assessment and Plan / UC Course  I have reviewed the triage vital signs and the nursing notes.  Pertinent labs & imaging results that were available during my care of the patient were reviewed by me and considered in my medical decision making (see chart for details).    Will treat with antibiotic drops, encouraged follow up with any further concerns.   Final Clinical Impressions(s) / UC Diagnoses   Final diagnoses:  Abrasion of right cornea, initial encounter   Discharge Instructions   None    ED Prescriptions     Medication Sig Dispense Auth. Provider   trimethoprim-polymyxin b (POLYTRIM) ophthalmic solution Place 1 drop into the right eye every 4 (four) hours for 7 days. 10 mL Francene Finders, PA-C      PDMP not reviewed this encounter.   Francene Finders, PA-C 08/31/21 1537

## 2021-10-18 ENCOUNTER — Other Ambulatory Visit: Payer: Self-pay

## 2021-10-18 ENCOUNTER — Encounter: Payer: Self-pay | Admitting: Plastic Surgery

## 2021-10-18 ENCOUNTER — Ambulatory Visit: Payer: BC Managed Care – PPO | Admitting: Plastic Surgery

## 2021-10-18 VITALS — BP 119/82 | HR 79 | Ht 64.0 in | Wt 175.0 lb

## 2021-10-18 DIAGNOSIS — M793 Panniculitis, unspecified: Secondary | ICD-10-CM

## 2021-10-18 NOTE — Progress Notes (Signed)
? ?  Subjective:  ? ? Patient ID: Bianca Maxwell, female    DOB: 1990/09/13, 31 y.o.   MRN: [id number] ? ?The patient is a 31 year old female here for follow-up on her abdomen.  She underwent a panniculectomy last year.  Since then she has been very active and done some toning and workouts which has probably led to some changes in her abdomen.  She has a little bit paining over and a little bit of a flap or closing of her umbilicus.  This is leading to yeast infections that is bothering her.  She also does not like the way her mons hangs down.  Otherwise she is doing really well and is happy. ? ? ? ? ?Review of Systems  ?Constitutional:  Positive for activity change. Negative for appetite change.  ?Eyes: Negative.   ?Respiratory: Negative.    ?Cardiovascular: Negative.   ?Gastrointestinal: Negative.  Negative for abdominal distention and abdominal pain.  ?Genitourinary: Negative.   ?Musculoskeletal: Negative.   ?Skin:  Positive for rash.  ? ?   ?Objective:  ? Physical Exam ?Constitutional:   ?   Appearance: Normal appearance.  ?HENT:  ?   Head: Atraumatic.  ?Cardiovascular:  ?   Rate and Rhythm: Normal rate.  ?   Pulses: Normal pulses.  ?Pulmonary:  ?   Effort: Pulmonary effort is normal.  ?Abdominal:  ?   General: There is no distension.  ?   Palpations: Abdomen is soft.  ?   Tenderness: There is no abdominal tenderness.  ?Musculoskeletal:     ?   General: No swelling or deformity.  ?Skin: ?   General: Skin is warm.  ?   Capillary Refill: Capillary refill takes less than 2 seconds.  ?   Coloration: Skin is not jaundiced.  ?   Findings: No bruising.  ?Neurological:  ?   Mental Status: She is alert and oriented to person, place, and time.  ?Psychiatric:     ?   Mood and Affect: Mood normal.     ?   Behavior: Behavior normal.     ?   Thought Content: Thought content normal.  ? ? ?   ?Assessment & Plan:  ? ?  ICD-10-CM   ?1. Panniculitis  M79.3   ?  ?  ?The patient is interested in a revision.   ?We will send her a quote  for the revision of abdomen. ? ? ?Pictures were obtained of the patient and placed in the chart with the patient's or guardian's permission. ? ?

## 2021-11-28 ENCOUNTER — Encounter: Payer: Self-pay | Admitting: Plastic Surgery

## 2022-09-20 ENCOUNTER — Ambulatory Visit
Admission: EM | Admit: 2022-09-20 | Discharge: 2022-09-20 | Disposition: A | Discharge status: Home / Self Care | Payer: BC Managed Care – PPO | Attending: Internal Medicine | Admitting: Internal Medicine

## 2022-09-20 ENCOUNTER — Ambulatory Visit (INDEPENDENT_AMBULATORY_CARE_PROVIDER_SITE_OTHER): Payer: BC Managed Care – PPO

## 2022-09-20 DIAGNOSIS — N3001 Acute cystitis with hematuria: Secondary | ICD-10-CM | POA: Diagnosis not present

## 2022-09-20 DIAGNOSIS — R109 Unspecified abdominal pain: Secondary | ICD-10-CM | POA: Diagnosis not present

## 2022-09-20 DIAGNOSIS — R1030 Lower abdominal pain, unspecified: Secondary | ICD-10-CM | POA: Insufficient documentation

## 2022-09-20 DIAGNOSIS — N898 Other specified noninflammatory disorders of vagina: Secondary | ICD-10-CM

## 2022-09-20 HISTORY — DX: Polycystic ovarian syndrome: E28.2

## 2022-09-20 LAB — POCT URINALYSIS DIP (MANUAL ENTRY)
Bilirubin, UA: NEGATIVE
Glucose, UA: NEGATIVE mg/dL
Nitrite, UA: NEGATIVE
Protein Ur, POC: NEGATIVE mg/dL
Spec Grav, UA: >=1.03 — AB (ref 1.010–1.025)
Urobilinogen, UA: 0.2 E.U./dL
pH, UA: 6 (ref 5.0–8.0)

## 2022-09-20 LAB — POCT URINE PREGNANCY: Preg Test, Ur: NEGATIVE

## 2022-09-20 MED ORDER — POLYETHYLENE GLYCOL 3350 17 G PO PACK: 17.0000 g | PACK | Freq: Every day | ORAL | 0 refills | Status: AC

## 2022-09-20 MED ORDER — CEPHALEXIN 500 MG PO CAPS: 500.0000 mg | ORAL_CAPSULE | Freq: Two times a day (BID) | ORAL | 0 refills | Status: DC

## 2022-09-20 NOTE — ED Provider Notes (Signed)
[facility]    CSN: [id number] Arrival date & time: 09/20/22  [id number]      History   Chief Complaint Chief Complaint  Patient presents with   Abdominal Pain    HPI Bianca Maxwell is a 32 y.o. female.   Patient presents with lower abdominal pain that has been present for about 7 to 8 days.  Patient describes it as sharp, constant pain.  Rates it 6/10 on pain scale.  Denies nausea, vomiting, diarrhea.  Patient is concerned for possible constipation given that her last 2 bowel movements have been "hard".  Last BM was this morning.  Denies any associated blood in stool.  Patient has been having extra flatulence.  Denies any associated fever.  Patient denies dysuria, urinary frequency, hematuria.  Patient does report that she has a vaginal discharge but states that it appears normal for this point in her cycle.  She states that she was supposed to start her menstrual cycle about 8 days ago but has not yet done this.  She states that her vaginal discharge typically changes around the start of her menstrual cycle so is not sure if this is related to this or not.  Denies any vaginal odor.  Denies any exposure to STD but has had unprotected sexual intercourse with her husband.  Patient reports that her husband has had a vasectomy so she has no concern for pregnancy.  Denies back pain.   Abdominal Pain   Past Medical History:  Diagnosis Date   Chronic kidney disease    Normal pregnancy in multigravida in third trimester 02/15/2014   PCOS (polycystic ovarian syndrome)    SVD (spontaneous vaginal delivery) 02/16/2014    Patient Active Problem List   Diagnosis Date Noted   S/P abdominoplasty 09/03/2020   Panniculitis 04/10/2020   Inguinal hernia 04/10/2020   Supervision of normal pregnancy in third trimester 02/16/2014   SVD (spontaneous vaginal delivery) 02/16/2014   Normal pregnancy in multigravida in third trimester 02/15/2014    History reviewed. No pertinent surgical  history.  OB History     Gravida  4   Para  3   Term  3   Preterm      AB      Living  3      SAB      IAB      Ectopic      Multiple      Live Births  3            Home Medications    Prior to Admission medications   Medication Sig Start Date End Date Taking? Authorizing Provider  cephALEXin (KEFLEX) 500 MG capsule Take 1 capsule (500 mg total) by mouth 2 (two) times daily for 7 days. 09/20/22 09/27/22 Yes Labaron Digirolamo, Michele Rockers, FNP  polyethylene glycol (MIRALAX) 17 g packet Take 17 g by mouth daily. 09/20/22  Yes Teodora Medici, FNP    Family History History reviewed. No pertinent family history.  Social History Social History   Tobacco Use   Smoking status: Former    Packs/day: 0.50    Types: Cigarettes    Quit date: 11/26/2019    Years since quitting: 2.8   Smokeless tobacco: Never  Substance Use Topics   Alcohol use: No   Drug use: No     Allergies   Latex   Review of Systems Review of Systems Per HPI  Physical Exam Triage Vital Signs ED Triage Vitals [09/20/22 1111]  Enc Vitals  Group     BP 108/72     Pulse Rate 60     Resp 16     Temp 98 F (36.7 C)     Temp Source Oral     SpO2 98 %     Weight      Height      Head Circumference      Peak Flow      Pain Score 5     Pain Loc      Pain Edu?      Excl. in [place]?    No data found.  Updated Vital Signs BP 108/72 (BP Location: Left Arm)   Pulse 60   Temp 98 F (36.7 C) (Oral)   Resp 16   SpO2 98%   Visual Acuity Right Eye Distance:   Left Eye Distance:   Bilateral Distance:    Right Eye Near:   Left Eye Near:    Bilateral Near:     Physical Exam Constitutional:      General: She is not in acute distress.    Appearance: Normal appearance. She is not toxic-appearing or diaphoretic.  HENT:     Head: Normocephalic and atraumatic.  Eyes:     Extraocular Movements: Extraocular movements intact.     Conjunctiva/sclera: Conjunctivae normal.  Cardiovascular:     Rate and  Rhythm: Normal rate and regular rhythm.     Pulses: Normal pulses.     Heart sounds: Normal heart sounds.  Pulmonary:     Effort: Pulmonary effort is normal. No respiratory distress.     Breath sounds: Normal breath sounds.  Abdominal:     General: Bowel sounds are normal. There is no distension.     Palpations: Abdomen is soft.     Tenderness: There is no abdominal tenderness.  Genitourinary:    Comments: Deferred with shared decision making.  Self swab performed. Neurological:     General: No focal deficit present.     Mental Status: She is alert and oriented to person, place, and time. Mental status is at baseline.  Psychiatric:        Mood and Affect: Mood normal.        Behavior: Behavior normal.        Thought Content: Thought content normal.        Judgment: Judgment normal.      UC Treatments / Results  Labs (all labs ordered are listed, but only abnormal results are displayed) Labs Reviewed  POCT URINALYSIS DIP (MANUAL ENTRY) - Abnormal; Notable for the following components:      Result Value   Clarity, UA hazy (*)    Ketones, POC UA trace (5) (*)    Spec Grav, UA >=1.030 (*)    Blood, UA trace-intact (*)    Leukocytes, UA Small (1+) (*)    All other components within normal limits  URINE CULTURE  CBC  COMPREHENSIVE METABOLIC PANEL  POCT URINE PREGNANCY  CERVICOVAGINAL ANCILLARY ONLY    EKG   Radiology DG Abd 2 Views  Result Date: 09/20/2022 CLINICAL DATA:  Abdominal pain. Constipation. EXAM: ABDOMEN - 2 VIEW COMPARISON:  None Available. FINDINGS: Supine and upright views of the abdomen obtained. There is no free intra-abdominal air. No bowel dilatation or evidence of obstruction. No bowel air-fluid levels. Moderate volume of stool in the colon. No abnormal rectal distention. Few scattered clips project over the abdomen. No visible radiopaque calculi or abnormal soft tissue calcifications. No acute osseous abnormalities are seen.  IMPRESSION: Moderate volume  of colonic stool. No bowel obstruction or free air. Electronically Signed   By: Keith Rake M.D.   On: 09/20/2022 12:51    Procedures Procedures (including critical care time)  Medications Ordered in UC Medications - No data to display  Initial Impression / Assessment and Plan / UC Course  I have reviewed the triage vital signs and the nursing notes.  Pertinent labs & imaging results that were available during my care of the patient were reviewed by me and considered in my medical decision making (see chart for details).     Suspect multifactorial cause to patient's abdominal pain.  Abdominal x-ray was completed that showed moderate stool burden which could be concerning for constipation especially given that patient is complaining of hard stools.  MiraLAX daily mixed in favorite beverage.  Advised adequate fluid hydration and high-fiber diet as well.  Patient's urinalysis is showing small amount of leukocytes which could indicate urinary tract infection given suprapubic pain patient is complaining of.  Will treat with cephalexin.  Urine culture is pending.  Given patient is reporting change in vaginal discharge, will send cervicovaginal swab as well to rule out vaginitis as etiology.  Patient is not concerned for STDs so will defer STD testing.  Urine pregnancy was negative.  Patient denies any concern for pregnancy given her husband's had a vasectomy so will defer quantitative hCG blood work.  CMP and CBC also pending for any other worrisome causes of abdominal pain.  Patient is concerned about missed menstrual cycle.  UTI/vaginitis could be cause of this and discussed that with the patient.  Although, patient was advised to follow-up with her established gynecologist for further evaluation and management of this as urine pregnancy was negative.  Patient was advised to go straight to the ER abdominal pain persists or worsens.  No signs of acute abdomen on exam at this time. Patient verbalized  understanding and was agreeable with plan. Final Clinical Impressions(s) / UC Diagnoses   Final diagnoses:  Lower abdominal pain  Acute cystitis with hematuria  Vaginal discharge     Discharge Instructions      Your abdominal x-ray is concerning for possible constipation.  I have prescribed MiraLAX for you to take daily until resolution.  Recommend high-fiber diet and increasing fluid intake.  Follow-up with the ER if abdominal pain persists or worsens.  I am also concerned that you have a UTI given urinalysis showing white blood cells.  I have prescribed antibiotic to treat this.  Urine culture and vaginal swab pending.  Will call if they are abnormal.  Urine pregnancy was negative.  Please try to get an earlier appointment with gynecology to have further workup for missed menstrual cycle.     ED Prescriptions     Medication Sig Dispense Auth. Provider   polyethylene glycol (MIRALAX) 17 g packet Take 17 g by mouth daily. 14 each Nirvaan Frett, [place] [address] [address], [place]   cephALEXin (KEFLEX) 500 MG capsule Take 1 capsule (500 mg total) by mouth 2 (two) times daily for 7 days. 14 capsule [place], Michele Rockers, [place]      PDMP not reviewed this encounter.   Teodora Medici, [place] 09/20/22 [phone]

## 2022-09-20 NOTE — Discharge Instructions (Signed)
Your abdominal x-ray is concerning for possible constipation.  I have prescribed MiraLAX for you to take daily until resolution.  Recommend high-fiber diet and increasing fluid intake.  Follow-up with the ER if abdominal pain persists or worsens.  I am also concerned that you have a UTI given urinalysis showing white blood cells.  I have prescribed antibiotic to treat this.  Urine culture and vaginal swab pending.  Will call if they are abnormal.  Urine pregnancy was negative.  Please try to get an earlier appointment with gynecology to have further workup for missed menstrual cycle.

## 2022-09-20 NOTE — ED Triage Notes (Signed)
Pt c/o abd cramping x 1 week. Was expecting her cycle yesterday. Also c/o being gassy, mild vaginal discharge without odor. Onset ~ 8 days ago

## 2022-09-21 ENCOUNTER — Ambulatory Visit: Payer: BC Managed Care – PPO

## 2022-09-21 LAB — URINE CULTURE: Culture: <10000 — AB

## 2022-09-21 LAB — CBC
Hematocrit: 47.2 % — ABNORMAL HIGH (ref 34.0–46.6)
Hemoglobin: 15.6 g/dL (ref 11.1–15.9)
MCH: 28.8 pg (ref 26.6–33.0)
MCHC: 33.1 g/dL (ref 31.5–35.7)
MCV: 87 fL (ref 79–97)
Platelets: 270 10*3/uL (ref 150–450)
RBC: 5.41 x10E6/uL — ABNORMAL HIGH (ref 3.77–5.28)
RDW: 12 % (ref 11.7–15.4)
WBC: 7.9 10*3/uL (ref 3.4–10.8)

## 2022-09-21 LAB — COMPREHENSIVE METABOLIC PANEL
ALT: 14 IU/L (ref 0–32)
AST: 13 IU/L (ref 0–40)
Albumin/Globulin Ratio: 2 (ref 1.2–2.2)
Albumin: 4.5 g/dL (ref 3.9–4.9)
Alkaline Phosphatase: 37 IU/L — ABNORMAL LOW (ref 44–121)
BUN/Creatinine Ratio: 8 — ABNORMAL LOW (ref 9–23)
BUN: 8 mg/dL (ref 6–20)
Bilirubin Total: 1 mg/dL (ref 0.0–1.2)
CO2: 19 mmol/L — ABNORMAL LOW (ref 20–29)
Calcium: 9.7 mg/dL (ref 8.7–10.2)
Chloride: 102 mmol/L (ref 96–106)
Creatinine, Ser: 1.02 mg/dL — ABNORMAL HIGH (ref 0.57–1.00)
Globulin, Total: 2.2 g/dL (ref 1.5–4.5)
Glucose: 80 mg/dL (ref 70–99)
Potassium: 4.5 mmol/L (ref 3.5–5.2)
Sodium: 136 mmol/L (ref 134–144)
Total Protein: 6.7 g/dL (ref 6.0–8.5)
eGFR: 75 mL/min/{1.73_m2} (ref >59)

## 2022-09-22 ENCOUNTER — Telehealth: Payer: Self-pay | Admitting: Nurse Practitioner

## 2022-09-22 LAB — CERVICOVAGINAL ANCILLARY ONLY
Bacterial Vaginitis (gardnerella): NEGATIVE
Candida Glabrata: NEGATIVE
Candida Vaginitis: NEGATIVE
Comment: NEGATIVE
Comment: NEGATIVE
Comment: NEGATIVE

## 2022-09-22 MED ORDER — CEPHALEXIN 500 MG PO CAPS: 500.0000 mg | ORAL_CAPSULE | Freq: Two times a day (BID) | ORAL | 0 refills | Status: AC

## 2022-09-22 MED ORDER — CEPHALEXIN 500 MG PO CAPS: 500.0000 mg | ORAL_CAPSULE | Freq: Two times a day (BID) | ORAL | 0 refills | Status: DC

## 2023-11-30 ENCOUNTER — Other Ambulatory Visit: Payer: Self-pay | Admitting: Obstetrics and Gynecology

## 2023-11-30 DIAGNOSIS — N644 Mastodynia: Secondary | ICD-10-CM

## 2023-12-15 ENCOUNTER — Ambulatory Visit
Admission: RE | Admit: 2023-12-15 | Discharge: 2023-12-15 | Disposition: A | Discharge status: Home / Self Care | Source: Ambulatory Visit | Attending: Obstetrics and Gynecology | Admitting: Obstetrics and Gynecology

## 2023-12-15 DIAGNOSIS — N644 Mastodynia: Secondary | ICD-10-CM
